# Patient Record
Sex: Female | Born: 1995 | Race: Black or African American | Hispanic: No | Marital: Single | Smoking: Never smoker
Health system: Southern US, Community
[De-identification: ages and names within clinical notes are randomized; demographics above are authoritative.]

## PROBLEM LIST (undated history)

## (undated) DIAGNOSIS — Z201 Contact with and (suspected) exposure to tuberculosis: Secondary | ICD-10-CM

## (undated) DIAGNOSIS — D649 Anemia, unspecified: Secondary | ICD-10-CM

## (undated) DIAGNOSIS — R112 Nausea with vomiting, unspecified: Secondary | ICD-10-CM

## (undated) DIAGNOSIS — K219 Gastro-esophageal reflux disease without esophagitis: Secondary | ICD-10-CM

## (undated) DIAGNOSIS — Z9109 Other allergy status, other than to drugs and biological substances: Secondary | ICD-10-CM

## (undated) DIAGNOSIS — Z9889 Other specified postprocedural states: Secondary | ICD-10-CM

## (undated) DIAGNOSIS — E282 Polycystic ovarian syndrome: Secondary | ICD-10-CM

## (undated) DIAGNOSIS — E119 Type 2 diabetes mellitus without complications: Secondary | ICD-10-CM

## (undated) DIAGNOSIS — Z23 Encounter for immunization: Secondary | ICD-10-CM

## (undated) DIAGNOSIS — E039 Hypothyroidism, unspecified: Secondary | ICD-10-CM

## (undated) DIAGNOSIS — R519 Headache, unspecified: Secondary | ICD-10-CM

## (undated) DIAGNOSIS — R51 Headache: Secondary | ICD-10-CM

## (undated) HISTORY — DX: Hypothyroidism, unspecified: E03.9

## (undated) HISTORY — DX: Encounter for immunization: Z23

## (undated) HISTORY — PX: COLONOSCOPY: SHX174

---
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
## 2014-01-08 MED ORDER — CEFAZOLIN SODIUM-DEXTROSE 2-3 GM-% IV SOLR
2.0000 g | INTRAVENOUS | Status: AC
  Administered 2014-01-09: 2 g via INTRAVENOUS
  Filled 2014-01-08: qty 50

## 2014-01-09 ENCOUNTER — Inpatient Hospital Stay (HOSPITAL_COMMUNITY)
Admission: RE | Admit: 2014-01-09 | Discharge: 2014-01-10 | DRG: 032 | Disposition: A | Discharge status: Home / Self Care | Payer: 59 | Source: Ambulatory Visit | Attending: Neurosurgery | Admitting: Neurosurgery

## 2014-01-09 ENCOUNTER — Encounter (HOSPITAL_COMMUNITY)
Admission: RE | Disposition: A | Discharge status: Home / Self Care | Payer: Self-pay | Source: Ambulatory Visit | Attending: Neurosurgery

## 2014-01-09 ENCOUNTER — Encounter (HOSPITAL_COMMUNITY): Payer: Self-pay | Admitting: *Deleted

## 2014-01-09 ENCOUNTER — Inpatient Hospital Stay (HOSPITAL_COMMUNITY): Payer: 59 | Admitting: Anesthesiology

## 2014-01-09 ENCOUNTER — Inpatient Hospital Stay (HOSPITAL_COMMUNITY): Payer: 59 | Admitting: Vascular Surgery

## 2014-01-09 DIAGNOSIS — E669 Obesity, unspecified: Secondary | ICD-10-CM | POA: Diagnosis present

## 2014-01-09 DIAGNOSIS — J3089 Other allergic rhinitis: Secondary | ICD-10-CM | POA: Diagnosis present

## 2014-01-09 DIAGNOSIS — Z91018 Allergy to other foods: Secondary | ICD-10-CM

## 2014-01-09 DIAGNOSIS — Z888 Allergy status to other drugs, medicaments and biological substances status: Secondary | ICD-10-CM | POA: Diagnosis not present

## 2014-01-09 DIAGNOSIS — Z79899 Other long term (current) drug therapy: Secondary | ICD-10-CM

## 2014-01-09 DIAGNOSIS — K219 Gastro-esophageal reflux disease without esophagitis: Secondary | ICD-10-CM | POA: Diagnosis present

## 2014-01-09 DIAGNOSIS — G932 Benign intracranial hypertension: Principal | ICD-10-CM | POA: Diagnosis present

## 2014-01-09 DIAGNOSIS — Z201 Contact with and (suspected) exposure to tuberculosis: Secondary | ICD-10-CM | POA: Diagnosis present

## 2014-01-09 DIAGNOSIS — I674 Hypertensive encephalopathy: Secondary | ICD-10-CM | POA: Diagnosis present

## 2014-01-09 DIAGNOSIS — G43909 Migraine, unspecified, not intractable, without status migrainosus: Secondary | ICD-10-CM | POA: Diagnosis present

## 2014-01-09 DIAGNOSIS — E282 Polycystic ovarian syndrome: Secondary | ICD-10-CM | POA: Diagnosis present

## 2014-01-09 DIAGNOSIS — E119 Type 2 diabetes mellitus without complications: Secondary | ICD-10-CM | POA: Diagnosis present

## 2014-01-09 HISTORY — PX: VENTRICULOPERITONEAL SHUNT: SHX204

## 2014-01-09 LAB — GLUCOSE, CAPILLARY
Glucose-Capillary: 64 mg/dL — ABNORMAL LOW (ref 70–99)
Glucose-Capillary: 64 mg/dL — ABNORMAL LOW (ref 70–99)
Glucose-Capillary: 94 mg/dL (ref 70–99)

## 2014-01-09 SURGERY — SHUNT INSERTION VENTRICULAR-PERITONEAL
Anesthesia: General | Site: Head | Laterality: Right

## 2014-01-09 MED ORDER — ONDANSETRON HCL 4 MG/2ML IJ SOLN
INTRAMUSCULAR | Status: DC | PRN
  Administered 2014-01-09: 4 mg via INTRAVENOUS

## 2014-01-09 MED ORDER — ROCURONIUM BROMIDE 50 MG/5ML IV SOLN
INTRAVENOUS | Status: AC
  Filled 2014-01-09: qty 1

## 2014-01-09 MED ORDER — MIDAZOLAM HCL 2 MG/2ML IJ SOLN
INTRAMUSCULAR | Status: AC
  Filled 2014-01-09: qty 2

## 2014-01-09 MED ORDER — THROMBIN 5000 UNITS EX SOLR
CUTANEOUS | Status: DC | PRN
  Administered 2014-01-09 (×2): 5000 [IU] via TOPICAL

## 2014-01-09 MED ORDER — NALOXONE HCL 0.4 MG/ML IJ SOLN: 0.0800 mg | INTRAMUSCULAR | Status: DC | PRN

## 2014-01-09 MED ORDER — FLEET ENEMA 7-19 GM/118ML RE ENEM
1.0000 | ENEMA | Freq: Once | RECTAL | Status: AC | PRN
  Filled 2014-01-09: qty 1

## 2014-01-09 MED ORDER — OXYCODONE HCL 5 MG PO TABS: 5.0000 mg | ORAL_TABLET | Freq: Once | ORAL | Status: DC | PRN

## 2014-01-09 MED ORDER — FERROUS SULFATE 28 MG PO TABS: 28.0000 mg | ORAL_TABLET | Freq: Every day | ORAL | Status: DC

## 2014-01-09 MED ORDER — FERROUS SULFATE 325 (65 FE) MG PO TABS
325.0000 mg | ORAL_TABLET | Freq: Every day | ORAL | Status: DC
  Administered 2014-01-09 – 2014-01-10 (×2): 325 mg via ORAL
  Filled 2014-01-09 (×3): qty 1

## 2014-01-09 MED ORDER — PROPOFOL 10 MG/ML IV BOLUS
INTRAVENOUS | Status: DC | PRN
  Administered 2014-01-09: 150 mg via INTRAVENOUS
  Administered 2014-01-09: 50 mg via INTRAVENOUS

## 2014-01-09 MED ORDER — SODIUM CHLORIDE 0.9 % IV SOLN
10.0000 mg | INTRAVENOUS | Status: DC | PRN
  Administered 2014-01-09: 10 ug/min via INTRAVENOUS

## 2014-01-09 MED ORDER — FENTANYL CITRATE 0.05 MG/ML IJ SOLN
INTRAMUSCULAR | Status: DC | PRN
  Administered 2014-01-09 (×3): 50 ug via INTRAVENOUS
  Administered 2014-01-09 (×2): 75 ug via INTRAVENOUS

## 2014-01-09 MED ORDER — PROPOFOL 10 MG/ML IV BOLUS
INTRAVENOUS | Status: AC
  Filled 2014-01-09: qty 20

## 2014-01-09 MED ORDER — GLYCOPYRROLATE 0.2 MG/ML IJ SOLN
INTRAMUSCULAR | Status: AC
  Filled 2014-01-09: qty 2

## 2014-01-09 MED ORDER — PHENYLEPHRINE HCL 10 MG/ML IJ SOLN
INTRAMUSCULAR | Status: DC | PRN
  Administered 2014-01-09: 120 ug via INTRAVENOUS
  Administered 2014-01-09 (×2): 40 ug via INTRAVENOUS

## 2014-01-09 MED ORDER — LEVOCETIRIZINE DIHYDROCHLORIDE 5 MG PO TABS: 5.0000 mg | ORAL_TABLET | Freq: Every evening | ORAL | Status: DC

## 2014-01-09 MED ORDER — LORATADINE 10 MG PO TABS
10.0000 mg | ORAL_TABLET | Freq: Every day | ORAL | Status: DC
  Administered 2014-01-09 – 2014-01-10 (×2): 10 mg via ORAL
  Filled 2014-01-09 (×2): qty 1

## 2014-01-09 MED ORDER — METFORMIN HCL 500 MG PO TABS
500.0000 mg | ORAL_TABLET | Freq: Every day | ORAL | Status: DC
  Administered 2014-01-09: 500 mg via ORAL
  Filled 2014-01-09 (×2): qty 1

## 2014-01-09 MED ORDER — HYDROCODONE-ACETAMINOPHEN 5-325 MG PO TABS
1.0000 | ORAL_TABLET | ORAL | Status: DC | PRN
  Administered 2014-01-09 – 2014-01-10 (×3): 1 via ORAL
  Filled 2014-01-09 (×3): qty 1

## 2014-01-09 MED ORDER — HYDROMORPHONE HCL 1 MG/ML IJ SOLN
INTRAMUSCULAR | Status: AC
  Filled 2014-01-09: qty 1

## 2014-01-09 MED ORDER — ONDANSETRON HCL 4 MG/2ML IJ SOLN
INTRAMUSCULAR | Status: AC
  Filled 2014-01-09: qty 2

## 2014-01-09 MED ORDER — FUROSEMIDE 20 MG PO TABS
20.0000 mg | ORAL_TABLET | Freq: Two times a day (BID) | ORAL | Status: DC
  Administered 2014-01-09 – 2014-01-10 (×3): 20 mg via ORAL
  Filled 2014-01-09 (×4): qty 1

## 2014-01-09 MED ORDER — POTASSIUM CHLORIDE IN NACL 20-0.9 MEQ/L-% IV SOLN
INTRAVENOUS | Status: DC
  Administered 2014-01-09: 80 mL via INTRAVENOUS
  Administered 2014-01-10: 05:00:00 via INTRAVENOUS
  Filled 2014-01-09 (×4): qty 1000

## 2014-01-09 MED ORDER — MAGNESIUM OXIDE 400 MG PO TABS: 400.0000 mg | ORAL_TABLET | Freq: Every day | ORAL | Status: DC

## 2014-01-09 MED ORDER — LACTATED RINGERS IV SOLN
INTRAVENOUS | Status: DC
  Administered 2014-01-09 (×2): via INTRAVENOUS

## 2014-01-09 MED ORDER — MAGNESIUM OXIDE 400 (241.3 MG) MG PO TABS
400.0000 mg | ORAL_TABLET | Freq: Every day | ORAL | Status: DC
  Administered 2014-01-09 – 2014-01-10 (×2): 400 mg via ORAL
  Filled 2014-01-09 (×2): qty 1

## 2014-01-09 MED ORDER — FENTANYL CITRATE 0.05 MG/ML IJ SOLN
INTRAMUSCULAR | Status: AC
  Filled 2014-01-09: qty 5

## 2014-01-09 MED ORDER — LIDOCAINE HCL (CARDIAC) 20 MG/ML IV SOLN
INTRAVENOUS | Status: DC | PRN
  Administered 2014-01-09: 80 mg via INTRAVENOUS

## 2014-01-09 MED ORDER — CEFAZOLIN SODIUM 1-5 GM-% IV SOLN
1.0000 g | Freq: Three times a day (TID) | INTRAVENOUS | Status: AC
  Administered 2014-01-09 – 2014-01-10 (×2): 1 g via INTRAVENOUS
  Filled 2014-01-09 (×2): qty 50

## 2014-01-09 MED ORDER — 0.9 % SODIUM CHLORIDE (POUR BTL) OPTIME
TOPICAL | Status: DC | PRN
  Administered 2014-01-09: 1000 mL

## 2014-01-09 MED ORDER — HYDROMORPHONE HCL 1 MG/ML IJ SOLN
0.2500 mg | INTRAMUSCULAR | Status: DC | PRN
  Administered 2014-01-09 (×2): 0.5 mg via INTRAVENOUS

## 2014-01-09 MED ORDER — MIDAZOLAM HCL 5 MG/5ML IJ SOLN
INTRAMUSCULAR | Status: DC | PRN
  Administered 2014-01-09: 2 mg via INTRAVENOUS

## 2014-01-09 MED ORDER — NEOSTIGMINE METHYLSULFATE 10 MG/10ML IV SOLN
INTRAVENOUS | Status: DC | PRN
  Administered 2014-01-09: 3 mg via INTRAVENOUS

## 2014-01-09 MED ORDER — POLYETHYLENE GLYCOL 3350 17 G PO PACK
17.0000 g | PACK | Freq: Every day | ORAL | Status: DC | PRN
Start: 2014-01-09 — End: 2014-01-10
  Filled 2014-01-09: qty 1

## 2014-01-09 MED ORDER — POTASSIUM CHLORIDE CRYS ER 10 MEQ PO TBCR
10.0000 meq | EXTENDED_RELEASE_TABLET | Freq: Every day | ORAL | Status: DC
  Administered 2014-01-09 – 2014-01-10 (×2): 10 meq via ORAL
  Filled 2014-01-09 (×2): qty 1

## 2014-01-09 MED ORDER — PANTOPRAZOLE SODIUM 40 MG PO TBEC
40.0000 mg | DELAYED_RELEASE_TABLET | Freq: Every day | ORAL | Status: DC
  Administered 2014-01-09: 40 mg via ORAL
  Filled 2014-01-09: qty 1

## 2014-01-09 MED ORDER — PHENYLEPHRINE 40 MCG/ML (10ML) SYRINGE FOR IV PUSH (FOR BLOOD PRESSURE SUPPORT)
PREFILLED_SYRINGE | INTRAVENOUS | Status: AC
  Filled 2014-01-09: qty 10

## 2014-01-09 MED ORDER — MONTELUKAST SODIUM 10 MG PO TABS
10.0000 mg | ORAL_TABLET | Freq: Every day | ORAL | Status: DC
  Administered 2014-01-09: 10 mg via ORAL
  Filled 2014-01-09 (×2): qty 1

## 2014-01-09 MED ORDER — ROCURONIUM BROMIDE 100 MG/10ML IV SOLN
INTRAVENOUS | Status: DC | PRN
  Administered 2014-01-09: 50 mg via INTRAVENOUS

## 2014-01-09 MED ORDER — HEMOSTATIC AGENTS (NO CHARGE) OPTIME
TOPICAL | Status: DC | PRN
  Administered 2014-01-09: 1 via TOPICAL

## 2014-01-09 MED ORDER — ACETAZOLAMIDE 250 MG PO TABS
750.0000 mg | ORAL_TABLET | Freq: Three times a day (TID) | ORAL | Status: DC
  Administered 2014-01-09 – 2014-01-10 (×4): 750 mg via ORAL
  Filled 2014-01-09 (×5): qty 3

## 2014-01-09 MED ORDER — LIDOCAINE HCL (CARDIAC) 20 MG/ML IV SOLN
INTRAVENOUS | Status: AC
  Filled 2014-01-09: qty 5

## 2014-01-09 MED ORDER — PROMETHAZINE HCL 25 MG PO TABS
12.5000 mg | ORAL_TABLET | ORAL | Status: DC | PRN
  Administered 2014-01-09: 25 mg via ORAL
  Filled 2014-01-09: qty 1

## 2014-01-09 MED ORDER — GLYCOPYRROLATE 0.2 MG/ML IJ SOLN
INTRAMUSCULAR | Status: DC | PRN
  Administered 2014-01-09: 0.4 mg via INTRAVENOUS

## 2014-01-09 MED ORDER — OXYCODONE HCL 5 MG/5ML PO SOLN
5.0000 mg | Freq: Once | ORAL | Status: DC | PRN
Start: 2014-01-09 — End: 2014-01-09

## 2014-01-09 MED ORDER — ONDANSETRON HCL 4 MG/2ML IJ SOLN
4.0000 mg | INTRAMUSCULAR | Status: DC | PRN
  Administered 2014-01-09: 4 mg via INTRAVENOUS
  Filled 2014-01-09: qty 2

## 2014-01-09 MED ORDER — PROMETHAZINE HCL 25 MG/ML IJ SOLN: 6.2500 mg | INTRAMUSCULAR | Status: DC | PRN

## 2014-01-09 MED ORDER — ONDANSETRON HCL 4 MG PO TABS: 4.0000 mg | ORAL_TABLET | ORAL | Status: DC | PRN

## 2014-01-09 MED ORDER — MORPHINE SULFATE 2 MG/ML IJ SOLN
1.0000 mg | INTRAMUSCULAR | Status: DC | PRN
  Administered 2014-01-09: 1 mg via INTRAVENOUS
  Filled 2014-01-09: qty 1

## 2014-01-09 MED ORDER — BISACODYL 5 MG PO TBEC
5.0000 mg | DELAYED_RELEASE_TABLET | Freq: Every day | ORAL | Status: DC | PRN
  Filled 2014-01-09: qty 1

## 2014-01-09 MED ORDER — VECURONIUM BROMIDE 10 MG IV SOLR
INTRAVENOUS | Status: DC | PRN
  Administered 2014-01-09: 1 mg via INTRAVENOUS

## 2014-01-09 MED ORDER — SENNA 8.6 MG PO TABS
1.0000 | ORAL_TABLET | Freq: Two times a day (BID) | ORAL | Status: DC
  Administered 2014-01-09: 8.6 mg via ORAL
  Filled 2014-01-09 (×3): qty 1

## 2014-01-09 SURGICAL SUPPLY — 65 items
BLADE CLIPPER SURG (BLADE) ×2 IMPLANT
BLADE SURG 11 STRL SS (BLADE) ×2 IMPLANT
BOOT SUTURE AID YELLOW STND (SUTURE) ×2 IMPLANT
BRUSH SCRUB EZ 1% IODOPHOR (MISCELLANEOUS) IMPLANT
BUR ACORN 6.0 PRECISION (BURR) ×2 IMPLANT
CANISTER SUCT 3000ML (MISCELLANEOUS) ×2 IMPLANT
CLIP RANEY DISP (INSTRUMENTS) ×2 IMPLANT
CONNECTOR SHUNT 1.2MMX1.8MM (MISCELLANEOUS) ×2 IMPLANT
CONT SPEC 4OZ CLIKSEAL STRL BL (MISCELLANEOUS) ×2 IMPLANT
COVER BACK TABLE 60X90IN (DRAPES) IMPLANT
DECANTER SPIKE VIAL GLASS SM (MISCELLANEOUS) ×2 IMPLANT
DERMABOND ADHESIVE PROPEN (GAUZE/BANDAGES/DRESSINGS) ×1
DERMABOND ADVANCED .7 DNX6 (GAUZE/BANDAGES/DRESSINGS) ×1 IMPLANT
DRAPE INCISE IOBAN 85X60 (DRAPES) ×2 IMPLANT
DRAPE ORTHO SPLIT 77X108 STRL (DRAPES) ×2
DRAPE POUCH INSTRU U-SHP 10X18 (DRAPES) ×2 IMPLANT
DRAPE SURG ORHT 6 SPLT 77X108 (DRAPES) ×2 IMPLANT
DRSG OPSITE 4X5.5 SM (GAUZE/BANDAGES/DRESSINGS) ×2 IMPLANT
DRSG TEGADERM 4X4.75 (GAUZE/BANDAGES/DRESSINGS) ×2 IMPLANT
DRSG TELFA 3X8 NADH (GAUZE/BANDAGES/DRESSINGS) ×4 IMPLANT
DURAPREP 26ML APPLICATOR (WOUND CARE) ×2 IMPLANT
ELECT REM PT RETURN 9FT ADLT (ELECTROSURGICAL) ×2
ELECTRODE REM PT RTRN 9FT ADLT (ELECTROSURGICAL) ×1 IMPLANT
GAUZE SPONGE 4X4 16PLY XRAY LF (GAUZE/BANDAGES/DRESSINGS) ×2 IMPLANT
GLOVE BIOGEL PI IND STRL 7.5 (GLOVE) ×1 IMPLANT
GLOVE BIOGEL PI IND STRL 8 (GLOVE) ×2 IMPLANT
GLOVE BIOGEL PI INDICATOR 7.5 (GLOVE) ×1
GLOVE BIOGEL PI INDICATOR 8 (GLOVE) ×2
GLOVE ECLIPSE 6.5 STRL STRAW (GLOVE) ×4 IMPLANT
GLOVE ECLIPSE 7.5 STRL STRAW (GLOVE) ×6 IMPLANT
GLOVE EXAM NITRILE LRG STRL (GLOVE) IMPLANT
GLOVE EXAM NITRILE MD LF STRL (GLOVE) IMPLANT
GLOVE EXAM NITRILE XL STR (GLOVE) IMPLANT
GLOVE EXAM NITRILE XS STR PU (GLOVE) IMPLANT
GOWN STRL REUS W/ TWL LRG LVL3 (GOWN DISPOSABLE) ×2 IMPLANT
GOWN STRL REUS W/ TWL XL LVL3 (GOWN DISPOSABLE) ×1 IMPLANT
GOWN STRL REUS W/TWL 2XL LVL3 (GOWN DISPOSABLE) ×4 IMPLANT
GOWN STRL REUS W/TWL LRG LVL3 (GOWN DISPOSABLE) ×2
GOWN STRL REUS W/TWL XL LVL3 (GOWN DISPOSABLE) ×1
HEMOSTAT SURGICEL 2X14 (HEMOSTASIS) IMPLANT
KIT BASIN OR (CUSTOM PROCEDURE TRAY) ×2 IMPLANT
KIT ROOM TURNOVER OR (KITS) ×2 IMPLANT
MARKER SKIN DUAL TIP RULER LAB (MISCELLANEOUS) ×4 IMPLANT
NEEDLE HYPO 25X1 1.5 SAFETY (NEEDLE) ×2 IMPLANT
NS IRRIG 1000ML POUR BTL (IV SOLUTION) ×2 IMPLANT
PACK LAMINECTOMY NEURO (CUSTOM PROCEDURE TRAY) ×2 IMPLANT
PAD ARMBOARD 7.5X6 YLW CONV (MISCELLANEOUS) ×4 IMPLANT
SPONGE LAP 4X18 X RAY DECT (DISPOSABLE) IMPLANT
SPONGE SURGIFOAM ABS GEL 12-7 (HEMOSTASIS) IMPLANT
STAPLER SKIN PROX WIDE 3.9 (STAPLE) ×2 IMPLANT
STRIP CLOSURE SKIN 1/4X4 (GAUZE/BANDAGES/DRESSINGS) IMPLANT
SUT BONE WAX W31G (SUTURE) IMPLANT
SUT ETHILON 3 0 PS 1 (SUTURE) IMPLANT
SUT NURALON 4 0 TR CR/8 (SUTURE) IMPLANT
SUT SILK 0 TIES 10X30 (SUTURE) ×2 IMPLANT
SUT SILK 3 0 SH 30 (SUTURE) IMPLANT
SUT VIC AB 2-0 CT2 18 VCP726D (SUTURE) ×6 IMPLANT
SUT VIC AB 3-0 SH 8-18 (SUTURE) ×4 IMPLANT
SYR CONTROL 10ML LL (SYRINGE) ×2 IMPLANT
SYR TB 1ML 25GX5/8 (SYRINGE) ×2 IMPLANT
TOWEL OR 17X24 6PK STRL BLUE (TOWEL DISPOSABLE) ×4 IMPLANT
TOWEL OR 17X26 10 PK STRL BLUE (TOWEL DISPOSABLE) ×2 IMPLANT
UNDERPAD 30X30 INCONTINENT (UNDERPADS AND DIAPERS) IMPLANT
VALVE RT ANGLE UNITIZE DIST (Valve) ×4 IMPLANT
WATER STERILE IRR 1000ML POUR (IV SOLUTION) ×2 IMPLANT

## 2014-01-09 NOTE — Transfer of Care (Signed)
Immediate Anesthesia Transfer of Care Note  Patient: Theresa Hicks  Procedure(s) Performed: Procedure(s) with comments: SHUNT INSERTION VENTRICULAR-PERITONEAL (Right) - right   Patient Location: PACU  Anesthesia Type:General  Level of Consciousness: awake, alert  and oriented  Airway & Oxygen Therapy: Patient Spontanous Breathing and Patient connected to nasal cannula oxygen  Post-op Assessment: Report given to PACU RN, Post -op Vital signs reviewed and stable and Patient moving all extremities X 4  Post vital signs: Reviewed and stable  Complications: No apparent anesthesia complications

## 2014-01-09 NOTE — Progress Notes (Signed)
eLink Physician-Brief Progress Note Patient Name: Theresa Hicks DOB: 1995/03/20 MRN: [id number]   Date of Service  01/09/2014  HPI/Events of Note  New patient Post VP shunt for pseudotumor  eICU Interventions  No eICU intervention needed     Intervention Category Evaluation Type: New Patient Evaluation  Theresa Hicks 01/09/2014, 5:19 PM

## 2014-01-09 NOTE — Anesthesia Procedure Notes (Signed)
Procedure Name: Intubation Date/Time: 01/09/2014 12:10 PM Performed by: Sharlene DoryWALKER, Kayelee Herbig E Pre-anesthesia Checklist: Patient identified, Emergency Drugs available, Suction available, Patient being monitored and Timeout performed Patient Re-evaluated:Patient Re-evaluated prior to inductionOxygen Delivery Method: Circle system utilized Preoxygenation: Pre-oxygenation with 100% oxygen Intubation Type: IV induction Ventilation: Mask ventilation without difficulty Laryngoscope Size: Mac and 4 Grade View: Grade I Tube type: Oral Tube size: 7.0 mm Number of attempts: 1 Airway Equipment and Method: Stylet Placement Confirmation: ETT inserted through vocal cords under direct vision,  positive ETCO2 and breath sounds checked- equal and bilateral Secured at: 21 cm Tube secured with: Tape Dental Injury: Teeth and Oropharynx as per pre-operative assessment

## 2014-01-09 NOTE — Anesthesia Preprocedure Evaluation (Addendum)
Anesthesia Evaluation  Patient identified by MRN, date of birth, ID band Patient awake    Reviewed: Allergy & Precautions, H&P , NPO status , Patient's Chart, lab work & pertinent test results  History of Anesthesia Complications Negative for: history of anesthetic complications  Airway Mallampati: I   Neck ROM: Full    Dental  (+) Teeth Intact   Pulmonary  breath sounds clear to auscultation        Cardiovascular Rhythm:Regular Rate:Normal     Neuro/Psych  Headaches,    GI/Hepatic GERD-  ,  Endo/Other  diabetes  Renal/GU      Musculoskeletal   Abdominal   Peds  Hematology   Anesthesia Other Findings   Reproductive/Obstetrics                            Anesthesia Physical Anesthesia Plan  ASA: II  Anesthesia Plan: General   Post-op Pain Management:    Induction:   Airway Management Planned: Oral ETT  Additional Equipment:   Intra-op Plan:   Post-operative Plan: Extubation in OR  Informed Consent: I have reviewed the patients History and Physical, chart, labs and discussed the procedure including the risks, benefits and alternatives for the proposed anesthesia with the patient or authorized representative who has indicated his/her understanding and acceptance.   Dental advisory given  Plan Discussed with: CRNA and Surgeon  Anesthesia Plan Comments:         Anesthesia Quick Evaluation

## 2014-01-09 NOTE — Op Note (Signed)
01/09/2014  3:19 PM  PATIENT:  Theresa Hicks  18 y.o. female with intracranial hypertension and persistent headaches  PRE-OPERATIVE DIAGNOSIS:  idiopathic intracranial hypertension  POST-OPERATIVE DIAGNOSIS:  Idiopathic intracranial hypertension  PROCEDURE:  right Frontal ventriculoperitoneal shunt creation Codman-Hakim programmable valve set at 18900mmH2O  SURGEON:  Surgeon(s): Coletta MemosKyle Athena Baltz, MD Tia Alertavid S Jones, MD  ASSISTANTS:Jones, Onalee Huaavid  ANESTHESIA:   General  EBL:  Total I/O In: 1300 [I.V.:1300] Out: 100 [Blood:100]  COUNT:per nursing  SPECIMEN:  No Specimen  DICTATION: Ms. Alferd PateeRichmond was brought to the operating room intubated and placed under a general anesthetic without difficulty. She was positioned supine on the operating room table. Her head was shaved and prepped in a sterile manner. The neck and abdomen was also prepped and draped in a sterile manner. I infiltrated lidocaine into the abdomen, in the right upper quadrant just off the midline. I also infiltrated lidocaine into the planned scalp incision.  I opened the abdomen with a 10 blade and dissected through the subcutaneous tissue to the anterior rectus sheath. I opened the anterior rectus sheath with Metzenbaum scissors. I then bluntly divided the rectus muscle and retracted it with vicryl sutures, exposing the posterior rectus sheath. I opened the posterior rectus sheath with Metzenbaum scissors just enough to expose the peritoneum. I pulled the peritoneum up with straight snaps and secured and released it a few times to make sure no bowel would be harmed. I opened the peritoneum with the scissors and secured the edges with the snaps.  I then tunneled from the abdominal incision to the post auricular region. I opened the skin with a scalpel over the tunneler then brought the tunneler through the incision. I passed a silk tie from the post auricular incision to the abdominal incisioin, securing the ends with hemostats. I  then opened the scalp incision with a 10 blade. I created a burr hole with the drill and acorn attachment. I tunneled from the scalp incision to the post auricular incision using another silk tie. I brought the shunt into the operative field and using the ties pulled the distal end of the catheter out of the abdominal incision.  I opened the dura and placed the ventricular catheter into the lateral ventricle. We then cut the catheter and secured it to the integrated shunt. We observed  flow from the distal end of the catheter. At this time the distal catheter was placed into the peritoneum, and the abdominal incision was closed in layers using vicryl sutures. Dermabond was used for a sterile dressing. The cranial incisions were then closed with galeal sutures, and the scalp with staples. Sterile dressings were applied.   PLAN OF CARE: Admit to inpatient   PATIENT DISPOSITION:  PACU - hemodynamically stable.   Delay start of Pharmacological VTE agent (>24hrs) due to surgical blood loss or risk of bleeding:  yes

## 2014-01-09 NOTE — H&P (Signed)
Theresa Hicks is an 18 y.o. female.   Chief Complaint: with psuedotumor cerebri HPI: history of headaches, relieved by spinal tap. Has decided to pursue a more durable treatment.  Past Medical History  Diagnosis Date  . Environmental allergies   . 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   Marland Kitchen. GERD (gastroesophageal reflux disease)   . Diabetes mellitus without complication     prediabetes, but takes Metformin for PCOS  . PCOS (polycystic ovarian syndrome)   . Headache     migraines & Intracranial hypertension     Past Surgical History  Procedure Laterality Date  . Wisdom tooth extraction  2015  . Colonoscopy      & endoscopy done at the same time as the colonoscopy    History reviewed. No pertinent family history. Social History:  reports that she has never smoked. She does not have any smokeless tobacco history on file. She reports that she does not drink alcohol or use illicit drugs.  Allergies:  Allergies  Allergen Reactions  . Imitrex [Sumatriptan] Anaphylaxis  . Peanuts [Peanut Oil] Anaphylaxis    Medications Prior to Admission  Medication Sig Dispense Refill  . acetaZOLAMIDE (DIAMOX) 250 MG tablet Take 750 mg by mouth 3 (three) times daily.    . Ferrous Sulfate 28 MG TABS Take 28 mg by mouth daily.    . furosemide (LASIX) 20 MG tablet Take 20 mg by mouth 2 (two) times daily.    Marland Kitchen. ketorolac (TORADOL) 30 MG/ML injection Inject 30 mg into the muscle every 30 (thirty) days.    Marland Kitchen. levocetirizine (XYZAL) 5 MG tablet Take 5 mg by mouth every evening.     . magnesium oxide (MAG-OX) 400 MG tablet Take 400 mg by mouth daily.    . metFORMIN (GLUCOPHAGE) 500 MG tablet Take 500 mg by mouth at bedtime.     . montelukast (SINGULAIR) 10 MG tablet Take 10 mg by mouth at bedtime.    . pantoprazole (PROTONIX) 40 MG tablet Take 40 mg by mouth at bedtime.     . potassium chloride (K-DUR,KLOR-CON) 10 MEQ tablet Take 10 mEq by mouth daily.       Results for orders placed or performed during the hospital encounter of 01/09/14 (from the past 48 hour(s))  Glucose, capillary     Status: Abnormal   Collection Time: 01/09/14  9:14 AM  Result Value Ref Range   Glucose-Capillary 64 (L) 70 - 99 mg/dL   No results found.  Review of Systems  Constitutional: Negative.   Eyes: Positive for blurred vision.  Respiratory: Negative.   Cardiovascular: Negative.   Gastrointestinal: Negative.   Genitourinary: Negative.   Musculoskeletal: Positive for neck pain.  Skin: Negative.   Neurological:       Headache  Endo/Heme/Allergies: Negative.   Psychiatric/Behavioral: Negative.     Blood pressure 132/55, pulse 74, temperature 98.7 F (37.1 C), temperature source Oral, resp. rate 20, height 5' 3.5" (1.613 m), weight 92.987 kg (205 lb), SpO2 100 %. Physical Exam  Vitals reviewed. Constitutional: She is oriented to person, place, and time. She appears well-developed and well-nourished.  HENT:  Head: Normocephalic and atraumatic.  Eyes: Conjunctivae and EOM are normal. Pupils are equal, round, and reactive to light.  Neck: Normal range of motion. Neck supple.  Cardiovascular: Normal rate, regular rhythm and normal heart sounds.   Respiratory: Effort normal and breath sounds normal.  GI: Soft. Bowel sounds are normal.  Musculoskeletal: Normal range of motion.  Neurological: She is alert and oriented to person, place, and time. She has normal reflexes. She displays normal reflexes. No cranial nerve deficit. She exhibits normal muscle tone. Coordination normal.  Skin: Skin is warm and dry.  Psychiatric: She has a normal mood and affect. Her behavior is normal. Judgment and thought content normal.     Assessment/Plan OR for ventriculoperitoneal shunting  Forever Arechiga L 01/09/2014, 11:56 AM

## 2014-01-10 ENCOUNTER — Encounter (HOSPITAL_COMMUNITY): Payer: Self-pay

## 2014-01-10 ENCOUNTER — Inpatient Hospital Stay (HOSPITAL_COMMUNITY): Payer: 59

## 2014-01-10 MED ORDER — HYDROCODONE-ACETAMINOPHEN 5-325 MG PO TABS: 1.0000 | ORAL_TABLET | Freq: Four times a day (QID) | ORAL | Status: DC | PRN

## 2014-01-10 NOTE — Discharge Summary (Signed)
  Physician Discharge Summary  Patient ID: Theresa Hicks MRN: [id number] DOB/AGE: Oct 28, 1995 18 y.o.  Admit date: 01/09/2014 Discharge date: 01/10/2014  Admission Diagnoses:Intracranial hypertension  Discharge Diagnoses:  Active Problems:   Intracranial hypertension   Discharged Condition: good  Hospital Course: Ms. Alferd PateeRichmond was admitted and taken to the operating room for an uncomplicated placement of a Right frontal VP shunt. She has been headache free since the shunt placement. I turned her valve pressure to 80(codman hakim valve) prior to discharge. Her wounds are clean, dry, and without signs of infection. She is walking, tolerating a regular diet, and voiding. She will be sent home with followup in 10 days.   Treatments: surgery: as above  Discharge Exam: Blood pressure 106/68, pulse 107, temperature 98.9 F (37.2 C), temperature source Oral, resp. rate 26, height 5' 3.5" (1.613 m), weight 92.987 kg (205 lb), SpO2 100 %. General appearance: alert, cooperative, appears stated age and no distress Neurologic: Alert and oriented X 3, normal strength and tone. Normal symmetric reflexes. Normal coordination and gait  Disposition: Final discharge disposition not confirmed idiopathic intracranial hypertension    Medication List    TAKE these medications        acetaZOLAMIDE 250 MG tablet  Commonly known as:  DIAMOX  Take 750 mg by mouth 3 (three) times daily.     Ferrous Sulfate 28 MG Tabs  Take 28 mg by mouth daily.     furosemide 20 MG tablet  Commonly known as:  LASIX  Take 20 mg by mouth 2 (two) times daily.     HYDROcodone-acetaminophen 5-325 MG per tablet  Commonly known as:  NORCO  Take 1 tablet by mouth every 6 (six) hours as needed for moderate pain.     ketorolac 30 MG/ML injection  Commonly known as:  TORADOL  Inject 30 mg into the muscle every 30 (thirty) days.     levocetirizine 5 MG tablet  Commonly known as:  XYZAL  Take 5 mg by mouth every  evening.     magnesium oxide 400 MG tablet  Commonly known as:  MAG-OX  Take 400 mg by mouth daily.     metFORMIN 500 MG tablet  Commonly known as:  GLUCOPHAGE  Take 500 mg by mouth at bedtime.     montelukast 10 MG tablet  Commonly known as:  SINGULAIR  Take 10 mg by mouth at bedtime.     pantoprazole 40 MG tablet  Commonly known as:  PROTONIX  Take 40 mg by mouth at bedtime.     potassium chloride 10 MEQ tablet  Commonly known as:  K-DUR,KLOR-CON  Take 10 mEq by mouth daily.           Follow-up Information    Follow up with Zamzam Whinery L, MD In 10 days.   Specialty:  Neurosurgery   Why:  call for staple removal   Contact information:   [address] [place] [address] [phone]       Signed: Kohler Pellerito L 01/10/2014, 6:58 PM

## 2014-01-10 NOTE — Progress Notes (Signed)
UR completed.  Amberley Hamler, RN BSN [facility] CCM Trauma/Neuro ICU Case Manager [phone]  

## 2014-01-10 NOTE — Progress Notes (Signed)
Pt discharged from unit to home at 1857.  Discharge information given with patient and mothers understanding of instructions. Herma ArdMesser, Suraj Ramdass RN BSN.

## 2014-01-10 NOTE — Discharge Instructions (Signed)
.  kc °

## 2014-01-14 ENCOUNTER — Encounter (HOSPITAL_COMMUNITY): Payer: Self-pay | Admitting: Neurosurgery

## 2014-01-21 NOTE — Anesthesia Postprocedure Evaluation (Signed)
  Anesthesia Post-op Note  Patient: Theresa Hicks  Procedure(s) Performed: Procedure(s) with comments: SHUNT INSERTION VENTRICULAR-PERITONEAL (Right) - right   Patient Location: PACU  Anesthesia Type:General  Level of Consciousness: awake and alert   Airway and Oxygen Therapy: Patient Spontanous Breathing  Post-op Pain: mild  Post-op Assessment: Post-op Vital signs reviewed  Post-op Vital Signs: stable  Last Vitals:  Filed Vitals:   01/10/14 1800  BP: 106/68  Pulse: 107  Temp:   Resp: 26    Complications: No apparent anesthesia complications

## 2014-02-21 ENCOUNTER — Institutional Professional Consult (permissible substitution): Payer: 59 | Admitting: Neurology

## 2014-02-26 ENCOUNTER — Other Ambulatory Visit (HOSPITAL_COMMUNITY): Payer: Self-pay | Admitting: Neurosurgery

## 2014-02-28 ENCOUNTER — Encounter (HOSPITAL_COMMUNITY): Payer: Self-pay | Admitting: *Deleted

## 2014-02-28 MED ORDER — CEFAZOLIN SODIUM-DEXTROSE 2-3 GM-% IV SOLR
2.0000 g | INTRAVENOUS | Status: AC
  Administered 2014-03-01: 2 g via INTRAVENOUS
  Filled 2014-02-28: qty 50

## 2014-03-01 ENCOUNTER — Inpatient Hospital Stay (HOSPITAL_COMMUNITY): Payer: 59

## 2014-03-01 ENCOUNTER — Encounter (HOSPITAL_COMMUNITY): Payer: Self-pay | Admitting: *Deleted

## 2014-03-01 ENCOUNTER — Ambulatory Visit (HOSPITAL_COMMUNITY)
Admission: RE | Admit: 2014-03-01 | Discharge: 2014-03-02 | Disposition: A | Discharge status: Home / Self Care | Payer: 59 | Source: Ambulatory Visit | Attending: Neurosurgery | Admitting: Neurosurgery

## 2014-03-01 ENCOUNTER — Inpatient Hospital Stay (HOSPITAL_COMMUNITY): Payer: 59 | Admitting: Anesthesiology

## 2014-03-01 ENCOUNTER — Encounter (HOSPITAL_COMMUNITY)
Admission: RE | Disposition: A | Discharge status: Home / Self Care | Payer: Self-pay | Source: Ambulatory Visit | Attending: Neurosurgery

## 2014-03-01 DIAGNOSIS — Z201 Contact with and (suspected) exposure to tuberculosis: Secondary | ICD-10-CM | POA: Insufficient documentation

## 2014-03-01 DIAGNOSIS — G932 Benign intracranial hypertension: Principal | ICD-10-CM | POA: Diagnosis present

## 2014-03-01 DIAGNOSIS — Z91010 Allergy to peanuts: Secondary | ICD-10-CM | POA: Insufficient documentation

## 2014-03-01 DIAGNOSIS — K219 Gastro-esophageal reflux disease without esophagitis: Secondary | ICD-10-CM | POA: Diagnosis not present

## 2014-03-01 DIAGNOSIS — D649 Anemia, unspecified: Secondary | ICD-10-CM | POA: Insufficient documentation

## 2014-03-01 DIAGNOSIS — Z888 Allergy status to other drugs, medicaments and biological substances status: Secondary | ICD-10-CM | POA: Insufficient documentation

## 2014-03-01 DIAGNOSIS — E119 Type 2 diabetes mellitus without complications: Secondary | ICD-10-CM | POA: Insufficient documentation

## 2014-03-01 DIAGNOSIS — Z79899 Other long term (current) drug therapy: Secondary | ICD-10-CM | POA: Diagnosis not present

## 2014-03-01 HISTORY — DX: Other specified postprocedural states: R11.2

## 2014-03-01 HISTORY — PX: SHUNT REVISION VENTRICULAR-PERITONEAL: SHX6094

## 2014-03-01 HISTORY — DX: Anemia, unspecified: D64.9

## 2014-03-01 HISTORY — DX: Nausea with vomiting, unspecified: Z98.890

## 2014-03-01 LAB — BASIC METABOLIC PANEL
Anion gap: 6 (ref 5–15)
BUN: 7 mg/dL (ref 6–23)
CO2: 23 mmol/L (ref 19–32)
Calcium: 9.8 mg/dL (ref 8.4–10.5)
Chloride: 105 mmol/L (ref 96–112)
Creatinine, Ser: 0.81 mg/dL (ref 0.50–1.10)
GFR calc non Af Amer: >90 mL/min (ref >90)
GLUCOSE: 82 mg/dL (ref 70–99)
POTASSIUM: 4.2 mmol/L (ref 3.5–5.1)
Sodium: 134 mmol/L — ABNORMAL LOW (ref 135–145)

## 2014-03-01 LAB — GLUCOSE, CAPILLARY
GLUCOSE-CAPILLARY: 68 mg/dL — AB (ref 70–99)
Glucose-Capillary: 63 mg/dL — ABNORMAL LOW (ref 70–99)
Glucose-Capillary: 57 mg/dL — ABNORMAL LOW (ref 70–99)
Glucose-Capillary: 83 mg/dL (ref 70–99)
Glucose-Capillary: 120 mg/dL — ABNORMAL HIGH (ref 70–99)
Glucose-Capillary: 93 mg/dL (ref 70–99)

## 2014-03-01 LAB — CBC
HEMATOCRIT: 35.3 % — AB (ref 36.0–46.0)
HEMOGLOBIN: 11.7 g/dL — AB (ref 12.0–15.0)
MCH: 27.8 pg (ref 26.0–34.0)
MCHC: 33.1 g/dL (ref 30.0–36.0)
MCV: 83.8 fL (ref 78.0–100.0)
Platelets: 254 10*3/uL (ref 150–400)
RBC: 4.21 MIL/uL (ref 3.87–5.11)
RDW: 12.7 % (ref 11.5–15.5)
WBC: 4.9 10*3/uL (ref 4.0–10.5)

## 2014-03-01 LAB — POCT I-STAT GLUCOSE
GLUCOSE: 85 mg/dL (ref 70–99)
Operator id: 153281

## 2014-03-01 LAB — HCG, SERUM, QUALITATIVE: Preg, Serum: POSITIVE — AB

## 2014-03-01 LAB — HCG, QUANTITATIVE, PREGNANCY: hCG, Beta Chain, Quant, S: <1 m[IU]/mL (ref <5)

## 2014-03-01 SURGERY — REVISION, SHUNT, VENTRICULOPERITONEAL
Anesthesia: General | Site: Head

## 2014-03-01 MED ORDER — PANTOPRAZOLE SODIUM 40 MG PO TBEC
40.0000 mg | DELAYED_RELEASE_TABLET | Freq: Every day | ORAL | Status: DC
  Administered 2014-03-01: 40 mg via ORAL
  Filled 2014-03-01: qty 1

## 2014-03-01 MED ORDER — SENNA 8.6 MG PO TABS
1.0000 | ORAL_TABLET | Freq: Two times a day (BID) | ORAL | Status: DC
  Administered 2014-03-01 – 2014-03-02 (×2): 8.6 mg via ORAL
  Filled 2014-03-01 (×2): qty 1

## 2014-03-01 MED ORDER — DEXAMETHASONE SODIUM PHOSPHATE 4 MG/ML IJ SOLN
INTRAMUSCULAR | Status: DC | PRN
  Administered 2014-03-01: 8 mg via INTRAVENOUS

## 2014-03-01 MED ORDER — PROPOFOL 10 MG/ML IV BOLUS
INTRAVENOUS | Status: AC
  Filled 2014-03-01: qty 20

## 2014-03-01 MED ORDER — ARTIFICIAL TEARS OP OINT
TOPICAL_OINTMENT | OPHTHALMIC | Status: DC | PRN
  Administered 2014-03-01: 1 via OPHTHALMIC

## 2014-03-01 MED ORDER — LACTATED RINGERS IV SOLN
INTRAVENOUS | Status: DC | PRN
  Administered 2014-03-01 (×2): via INTRAVENOUS

## 2014-03-01 MED ORDER — ONDANSETRON HCL 4 MG/2ML IJ SOLN: 4.0000 mg | INTRAMUSCULAR | Status: DC | PRN

## 2014-03-01 MED ORDER — PROPOFOL 10 MG/ML IV BOLUS
INTRAVENOUS | Status: DC | PRN
  Administered 2014-03-01: 100 mg via INTRAVENOUS
  Administered 2014-03-01: 160 mg via INTRAVENOUS

## 2014-03-01 MED ORDER — LIDOCAINE HCL (CARDIAC) 20 MG/ML IV SOLN
INTRAVENOUS | Status: AC
  Filled 2014-03-01: qty 5

## 2014-03-01 MED ORDER — HYDROMORPHONE HCL 1 MG/ML IJ SOLN
INTRAMUSCULAR | Status: AC
  Filled 2014-03-01: qty 1

## 2014-03-01 MED ORDER — ROCURONIUM BROMIDE 50 MG/5ML IV SOLN
INTRAVENOUS | Status: AC
  Filled 2014-03-01: qty 1

## 2014-03-01 MED ORDER — ONDANSETRON HCL 4 MG PO TABS: 4.0000 mg | ORAL_TABLET | ORAL | Status: DC | PRN

## 2014-03-01 MED ORDER — LIDOCAINE HCL (CARDIAC) 20 MG/ML IV SOLN
INTRAVENOUS | Status: DC | PRN
  Administered 2014-03-01: 60 mg via INTRAVENOUS

## 2014-03-01 MED ORDER — FENTANYL CITRATE 0.05 MG/ML IJ SOLN
INTRAMUSCULAR | Status: DC | PRN
  Administered 2014-03-01: 100 ug via INTRAVENOUS
  Administered 2014-03-01: 50 ug via INTRAVENOUS
  Administered 2014-03-01: 100 ug via INTRAVENOUS

## 2014-03-01 MED ORDER — ACETAMINOPHEN 650 MG RE SUPP: 650.0000 mg | RECTAL | Status: DC | PRN

## 2014-03-01 MED ORDER — ONDANSETRON HCL 4 MG/2ML IJ SOLN
INTRAMUSCULAR | Status: AC
  Filled 2014-03-01: qty 2

## 2014-03-01 MED ORDER — MAGNESIUM CITRATE PO SOLN
1.0000 | Freq: Once | ORAL | Status: AC | PRN
  Filled 2014-03-01: qty 296

## 2014-03-01 MED ORDER — DEXTROSE 50 % IV SOLN
25.0000 mL | Freq: Once | INTRAVENOUS | Status: AC
  Administered 2014-03-01: 25 mL via INTRAVENOUS

## 2014-03-01 MED ORDER — DEXTROSE 50 % IV SOLN
INTRAVENOUS | Status: AC
Start: 2014-03-01 — End: 2014-03-01
  Administered 2014-03-01: 25 mL via INTRAVENOUS
  Filled 2014-03-01: qty 50

## 2014-03-01 MED ORDER — PROMETHAZINE HCL 25 MG/ML IJ SOLN: 6.2500 mg | INTRAMUSCULAR | Status: DC | PRN

## 2014-03-01 MED ORDER — NEOSTIGMINE METHYLSULFATE 10 MG/10ML IV SOLN
INTRAVENOUS | Status: DC | PRN
Start: 2014-03-01 — End: 2014-03-01
  Administered 2014-03-01: 5 mg via INTRAVENOUS

## 2014-03-01 MED ORDER — ACETAMINOPHEN 325 MG PO TABS: 650.0000 mg | ORAL_TABLET | ORAL | Status: DC | PRN

## 2014-03-01 MED ORDER — METFORMIN HCL 500 MG PO TABS
500.0000 mg | ORAL_TABLET | Freq: Every day | ORAL | Status: DC
  Administered 2014-03-01: 500 mg via ORAL
  Filled 2014-03-01: qty 1

## 2014-03-01 MED ORDER — LACTATED RINGERS IV SOLN
INTRAVENOUS | Status: DC
  Administered 2014-03-01: 15:00:00 via INTRAVENOUS

## 2014-03-01 MED ORDER — CEFAZOLIN SODIUM-DEXTROSE 2-3 GM-% IV SOLR
2.0000 g | Freq: Three times a day (TID) | INTRAVENOUS | Status: AC
Start: 2014-03-02 — End: 2014-03-02
  Administered 2014-03-01 – 2014-03-02 (×2): 2 g via INTRAVENOUS
  Filled 2014-03-01 (×2): qty 50

## 2014-03-01 MED ORDER — MONTELUKAST SODIUM 10 MG PO TABS
10.0000 mg | ORAL_TABLET | Freq: Every day | ORAL | Status: DC
  Administered 2014-03-01: 10 mg via ORAL
  Filled 2014-03-01: qty 1

## 2014-03-01 MED ORDER — MIDAZOLAM HCL 5 MG/5ML IJ SOLN
INTRAMUSCULAR | Status: DC | PRN
  Administered 2014-03-01: 2 mg via INTRAVENOUS

## 2014-03-01 MED ORDER — 0.9 % SODIUM CHLORIDE (POUR BTL) OPTIME
TOPICAL | Status: DC | PRN
  Administered 2014-03-01: 1000 mL

## 2014-03-01 MED ORDER — HYDROMORPHONE HCL 1 MG/ML IJ SOLN
0.2500 mg | INTRAMUSCULAR | Status: DC | PRN
  Administered 2014-03-01: 0.25 mg via INTRAVENOUS
  Administered 2014-03-01: 0.5 mg via INTRAVENOUS
  Administered 2014-03-01: 0.25 mg via INTRAVENOUS

## 2014-03-01 MED ORDER — PROMETHAZINE HCL 25 MG PO TABS
12.5000 mg | ORAL_TABLET | ORAL | Status: DC | PRN
  Administered 2014-03-01: 25 mg via ORAL
  Filled 2014-03-01: qty 1

## 2014-03-01 MED ORDER — ACETAZOLAMIDE 250 MG PO TABS
750.0000 mg | ORAL_TABLET | Freq: Three times a day (TID) | ORAL | Status: DC
  Administered 2014-03-01 – 2014-03-02 (×2): 750 mg via ORAL
  Filled 2014-03-01 (×4): qty 3

## 2014-03-01 MED ORDER — DEXAMETHASONE SODIUM PHOSPHATE 4 MG/ML IJ SOLN
INTRAMUSCULAR | Status: AC
  Filled 2014-03-01: qty 1

## 2014-03-01 MED ORDER — NALOXONE HCL 0.4 MG/ML IJ SOLN: 0.0800 mg | INTRAMUSCULAR | Status: DC | PRN

## 2014-03-01 MED ORDER — PANTOPRAZOLE SODIUM 40 MG IV SOLR: 40.0000 mg | Freq: Every day | INTRAVENOUS | Status: DC

## 2014-03-01 MED ORDER — BISACODYL 5 MG PO TBEC: 5.0000 mg | DELAYED_RELEASE_TABLET | Freq: Every day | ORAL | Status: DC | PRN

## 2014-03-01 MED ORDER — DEXTROSE 50 % IV SOLN
25.0000 mL | Freq: Once | INTRAVENOUS | Status: AC
  Administered 2014-03-01: 25 mL via INTRAVENOUS
  Filled 2014-03-01: qty 50

## 2014-03-01 MED ORDER — LABETALOL HCL 5 MG/ML IV SOLN: 10.0000 mg | INTRAVENOUS | Status: DC | PRN

## 2014-03-01 MED ORDER — POTASSIUM CHLORIDE IN NACL 20-0.9 MEQ/L-% IV SOLN
INTRAVENOUS | Status: DC
  Administered 2014-03-01: 23:00:00 via INTRAVENOUS
  Filled 2014-03-01: qty 1000

## 2014-03-01 MED ORDER — ONDANSETRON HCL 4 MG/2ML IJ SOLN
INTRAMUSCULAR | Status: AC
  Filled 2014-03-01: qty 6

## 2014-03-01 MED ORDER — ROCURONIUM BROMIDE 100 MG/10ML IV SOLN
INTRAVENOUS | Status: DC | PRN
  Administered 2014-03-01: 10 mg via INTRAVENOUS
  Administered 2014-03-01: 40 mg via INTRAVENOUS

## 2014-03-01 MED ORDER — GLYCOPYRROLATE 0.2 MG/ML IJ SOLN
INTRAMUSCULAR | Status: DC | PRN
  Administered 2014-03-01: .7 mg via INTRAVENOUS

## 2014-03-01 MED ORDER — HYDROCODONE-ACETAMINOPHEN 5-325 MG PO TABS: 1.0000 | ORAL_TABLET | Freq: Four times a day (QID) | ORAL | Status: DC | PRN

## 2014-03-01 MED ORDER — ONDANSETRON HCL 4 MG/2ML IJ SOLN
INTRAMUSCULAR | Status: DC | PRN
  Administered 2014-03-01: 4 mg via INTRAVENOUS

## 2014-03-01 MED ORDER — SENNOSIDES-DOCUSATE SODIUM 8.6-50 MG PO TABS: 1.0000 | ORAL_TABLET | Freq: Every evening | ORAL | Status: DC | PRN

## 2014-03-01 MED ORDER — FUROSEMIDE 20 MG PO TABS
20.0000 mg | ORAL_TABLET | Freq: Two times a day (BID) | ORAL | Status: DC
  Administered 2014-03-02: 20 mg via ORAL
  Filled 2014-03-01: qty 1

## 2014-03-01 MED ORDER — POTASSIUM CHLORIDE CRYS ER 10 MEQ PO TBCR
10.0000 meq | EXTENDED_RELEASE_TABLET | Freq: Every day | ORAL | Status: DC
  Administered 2014-03-02: 10 meq via ORAL
  Filled 2014-03-01: qty 1

## 2014-03-01 MED ORDER — MORPHINE SULFATE 2 MG/ML IJ SOLN: 1.0000 mg | INTRAMUSCULAR | Status: DC | PRN

## 2014-03-01 MED ORDER — HYDROCODONE-ACETAMINOPHEN 5-325 MG PO TABS
1.0000 | ORAL_TABLET | ORAL | Status: DC | PRN
  Administered 2014-03-01 – 2014-03-02 (×2): 1 via ORAL
  Filled 2014-03-01 (×2): qty 1

## 2014-03-01 SURGICAL SUPPLY — 87 items
BLADE CLIPPER SURG (BLADE) ×4 IMPLANT
BLADE SURG 10 STRL SS (BLADE) ×2 IMPLANT
BLADE SURG 11 STRL SS (BLADE) ×2 IMPLANT
BLADE SURG 15 STRL LF DISP TIS (BLADE) ×1 IMPLANT
BLADE SURG 15 STRL SS (BLADE) ×1
BOOT SUTURE AID YELLOW STND (SUTURE) IMPLANT
BRUSH SCRUB EZ 1% IODOPHOR (MISCELLANEOUS) ×2 IMPLANT
BUR ACORN 6.0 PRECISION (BURR) IMPLANT
CANISTER SUCT 3000ML (MISCELLANEOUS) ×2 IMPLANT
CATH STG VENTRICULAR ×2 IMPLANT
CLIP RANEY DISP (INSTRUMENTS) IMPLANT
CONT SPEC 4OZ CLIKSEAL STRL BL (MISCELLANEOUS) IMPLANT
CORDS BIPOLAR (ELECTRODE) ×2 IMPLANT
COVER BACK TABLE 60X90IN (DRAPES) ×4 IMPLANT
COVER MAYO STAND STRL (DRAPES) ×2 IMPLANT
DECANTER SPIKE VIAL GLASS SM (MISCELLANEOUS) ×2 IMPLANT
DRAPE INCISE IOBAN 85X60 (DRAPES) ×2 IMPLANT
DRAPE ORTHO SPLIT 77X108 STRL (DRAPES) ×1
DRAPE POUCH INSTRU U-SHP 10X18 (DRAPES) ×2 IMPLANT
DRAPE PROXIMA HALF (DRAPES) ×2 IMPLANT
DRAPE SURG ORHT 6 SPLT 77X108 (DRAPES) ×1 IMPLANT
DRSG OPSITE 4X5.5 SM (GAUZE/BANDAGES/DRESSINGS) ×2 IMPLANT
DRSG TELFA 3X8 NADH (GAUZE/BANDAGES/DRESSINGS) ×2 IMPLANT
DURAPREP 26ML APPLICATOR (WOUND CARE) IMPLANT
DURAPREP 6ML APPLICATOR 50/CS (WOUND CARE) ×2 IMPLANT
ELECT CAUTERY BLADE 6.4 (BLADE) ×2 IMPLANT
ELECT REM PT RETURN 9FT ADLT (ELECTROSURGICAL) ×2
ELECTRODE REM PT RTRN 9FT ADLT (ELECTROSURGICAL) ×1 IMPLANT
GAUZE SPONGE 4X4 16PLY XRAY LF (GAUZE/BANDAGES/DRESSINGS) ×4 IMPLANT
GLOVE BIO SURGEON STRL SZ 6.5 (GLOVE) ×2 IMPLANT
GLOVE BIO SURGEON STRL SZ7 (GLOVE) ×2 IMPLANT
GLOVE BIO SURGEON STRL SZ7.5 (GLOVE) IMPLANT
GLOVE BIO SURGEON STRL SZ8 (GLOVE) IMPLANT
GLOVE BIO SURGEON STRL SZ8.5 (GLOVE) IMPLANT
GLOVE BIOGEL M 8.0 STRL (GLOVE) IMPLANT
GLOVE ECLIPSE 6.5 STRL STRAW (GLOVE) ×2 IMPLANT
GLOVE ECLIPSE 7.0 STRL STRAW (GLOVE) IMPLANT
GLOVE ECLIPSE 7.5 STRL STRAW (GLOVE) IMPLANT
GLOVE ECLIPSE 8.0 STRL XLNG CF (GLOVE) IMPLANT
GLOVE ECLIPSE 8.5 STRL (GLOVE) IMPLANT
GLOVE EXAM NITRILE LRG STRL (GLOVE) IMPLANT
GLOVE EXAM NITRILE MD LF STRL (GLOVE) IMPLANT
GLOVE EXAM NITRILE XL STR (GLOVE) IMPLANT
GLOVE EXAM NITRILE XS STR PU (GLOVE) IMPLANT
GLOVE INDICATOR 6.5 STRL GRN (GLOVE) IMPLANT
GLOVE INDICATOR 7.0 STRL GRN (GLOVE) IMPLANT
GLOVE INDICATOR 7.5 STRL GRN (GLOVE) IMPLANT
GLOVE INDICATOR 8.0 STRL GRN (GLOVE) IMPLANT
GLOVE INDICATOR 8.5 STRL (GLOVE) IMPLANT
GLOVE OPTIFIT SS 8.0 STRL (GLOVE) IMPLANT
GLOVE SURG SS PI 6.5 STRL IVOR (GLOVE) IMPLANT
GOWN STRL REUS W/ TWL LRG LVL3 (GOWN DISPOSABLE) ×2 IMPLANT
GOWN STRL REUS W/ TWL XL LVL3 (GOWN DISPOSABLE) IMPLANT
GOWN STRL REUS W/TWL 2XL LVL3 (GOWN DISPOSABLE) IMPLANT
GOWN STRL REUS W/TWL LRG LVL3 (GOWN DISPOSABLE) ×2
GOWN STRL REUS W/TWL XL LVL3 (GOWN DISPOSABLE)
HEMOSTAT SURGICEL 2X14 (HEMOSTASIS) IMPLANT
KIT BASIN OR (CUSTOM PROCEDURE TRAY) ×2 IMPLANT
KIT ROOM TURNOVER OR (KITS) ×2 IMPLANT
MARKER SKIN DUAL TIP RULER LAB (MISCELLANEOUS) ×4 IMPLANT
MARKER SPHERE PSV REFLC 13MM (MARKER) ×4 IMPLANT
NEEDLE HYPO 25X1 1.5 SAFETY (NEEDLE) ×2 IMPLANT
NS IRRIG 1000ML POUR BTL (IV SOLUTION) ×2 IMPLANT
PACK EENT II TURBAN DRAPE (CUSTOM PROCEDURE TRAY) ×2 IMPLANT
PAD ARMBOARD 7.5X6 YLW CONV (MISCELLANEOUS) ×6 IMPLANT
PATTIES SURGICAL .5 X.5 (GAUZE/BANDAGES/DRESSINGS) IMPLANT
PENCIL BUTTON HOLSTER BLD 10FT (ELECTRODE) ×2 IMPLANT
SPONGE LAP 4X18 X RAY DECT (DISPOSABLE) ×2 IMPLANT
SPONGE SURGIFOAM ABS GEL 12-7 (HEMOSTASIS) IMPLANT
STAPLER SKIN PROX WIDE 3.9 (STAPLE) ×2 IMPLANT
STRIP CLOSURE SKIN 1/4X4 (GAUZE/BANDAGES/DRESSINGS) IMPLANT
STYLET DISP PRECALIBRATE (MISCELLANEOUS) ×2 IMPLANT
SUT BONE WAX W31G (SUTURE) ×2 IMPLANT
SUT ETHILON 3 0 PS 1 (SUTURE) ×2 IMPLANT
SUT NURALON 4 0 TR CR/8 (SUTURE) IMPLANT
SUT SILK 0 TIES 10X30 (SUTURE) ×2 IMPLANT
SUT SILK 2 0 TIES 10X30 (SUTURE) ×2 IMPLANT
SUT SILK 3 0 SH 30 (SUTURE) IMPLANT
SUT VIC AB 2-0 CT2 18 VCP726D (SUTURE) ×2 IMPLANT
SUT VIC AB 3-0 SH 8-18 (SUTURE) ×2 IMPLANT
SYR BULB 3OZ (MISCELLANEOUS) ×2 IMPLANT
SYR CONTROL 10ML LL (SYRINGE) ×2 IMPLANT
TOWEL OR 17X24 6PK STRL BLUE (TOWEL DISPOSABLE) ×2 IMPLANT
TOWEL OR 17X26 10 PK STRL BLUE (TOWEL DISPOSABLE) ×2 IMPLANT
TUBE CONNECTING 12X1/4 (SUCTIONS) ×2 IMPLANT
UNDERPAD 30X30 INCONTINENT (UNDERPADS AND DIAPERS) ×2 IMPLANT
WATER STERILE IRR 1000ML POUR (IV SOLUTION) ×2 IMPLANT

## 2014-03-01 NOTE — Anesthesia Procedure Notes (Signed)
Procedure Name: Intubation Date/Time: 03/01/2014 7:22 PM Performed by: Julianne RiceBILOTTA, Horris Speros Z Pre-anesthesia Checklist: Patient identified, Patient being monitored, Emergency Drugs available, Timeout performed and Suction available Patient Re-evaluated:Patient Re-evaluated prior to inductionOxygen Delivery Method: Circle system utilized Preoxygenation: Pre-oxygenation with 100% oxygen Intubation Type: IV induction Ventilation: Mask ventilation without difficulty Laryngoscope Size: Mac and 3 Grade View: Grade I Tube type: Oral Tube size: 7.0 mm Number of attempts: 1 Airway Equipment and Method: Stylet Placement Confirmation: ETT inserted through vocal cords under direct vision,  breath sounds checked- equal and bilateral and positive ETCO2 Secured at: 22 cm Tube secured with: Tape Dental Injury: Teeth and Oropharynx as per pre-operative assessment

## 2014-03-01 NOTE — Discharge Summary (Signed)
  Physician Discharge Summary  Patient ID: Theresa Hicks MRN: [id number] DOB/AGE: [birth year] 20 y.o.  Admit date: 03/01/2014 Discharge date: 03/01/2014  Admission Diagnoses:Elevated intracranial pressure  Discharge Diagnoses:  Active Problems:   Elevated intracranial pressure   Discharged Condition: good  Hospital Course: Theresa Hicks was taken to the operating room and had her shunt revised. The ventricular catheter was replaced with a better positioned catheter using stereotactic guidance. She is normal neurologically at discharge. Her wound is clean, dry, and without signs of infection at discharge. She is voiding, ambulating, and tolerating a regular diet.  Treatments: surgery: as above  Discharge Exam: Blood pressure 129/64, pulse 114, temperature 97.9 F (36.6 C), temperature source Oral, resp. rate 24, height 5\' 4"  (1.626 m), weight 95.255 kg (210 lb), SpO2 100 %. General appearance: alert, cooperative, appears stated age and no distress Neurologic: Alert and oriented X 3, normal strength and tone. Normal symmetric reflexes. Normal coordination and gait  Disposition: 01-Home or Self Care idiopathic intracranial hypertension    Medication List    ASK your doctor about these medications        acetaZOLAMIDE 250 MG tablet  Commonly known as:  DIAMOX  Take 750 mg by mouth 3 (three) times daily.     Ferrous Sulfate 28 MG Tabs  Take 28 mg by mouth daily.     furosemide 20 MG tablet  Commonly known as:  LASIX  Take 20 mg by mouth 2 (two) times daily.     HYDROcodone-acetaminophen 5-325 MG per tablet  Commonly known as:  NORCO  Take 1 tablet by mouth every 6 (six) hours as needed for moderate pain.     ketorolac 30 MG/ML injection  Commonly known as:  TORADOL  Inject 30 mg into the muscle every 30 (thirty) days.     levocetirizine 5 MG tablet  Commonly known as:  XYZAL  Take 5 mg by mouth every evening.     magnesium oxide 400 MG tablet  Commonly known as:   MAG-OX  Take 400 mg by mouth daily.     metFORMIN 500 MG tablet  Commonly known as:  GLUCOPHAGE  Take 500 mg by mouth at bedtime.     montelukast 10 MG tablet  Commonly known as:  SINGULAIR  Take 10 mg by mouth at bedtime.     pantoprazole 40 MG tablet  Commonly known as:  PROTONIX  Take 40 mg by mouth at bedtime.     potassium chloride 10 MEQ tablet  Commonly known as:  K-DUR,KLOR-CON  Take 10 mEq by mouth daily.         Signed: Rozelia Catapano L 03/01/2014, 9:24 PM

## 2014-03-01 NOTE — Progress Notes (Signed)
CBG=57, pt remains alert and oriented, denies feeling shaky. Dr. Jean RosenthalJackson called and informed with 1/2 amp D50 IV given as ordered. CBG=83 after IV D50. Dr. Jean RosenthalJackson also made aware of positive pregnancy test. Serum hcg quantitative added on. Pt denies being sexually active.

## 2014-03-01 NOTE — Progress Notes (Signed)
Spoke with lab regarding delay on quantitative, negative result in Epic now per [employer]. Neuro called and informed. Pt verbalized understanding of delay.

## 2014-03-01 NOTE — Op Note (Signed)
03/01/2014  9:04 PM  PATIENT:  Theresa Hicks  19 y.o. female  PRE-OPERATIVE DIAGNOSIS:  idiopathic intracranial hypertension  POST-OPERATIVE DIAGNOSIS:  idiopathic intracranial hypertension  PROCEDURE:  Procedure(s): right side SHUNT REVISION VENTRICULAR-PERITONEAL, sterotactic ventricular catheter placement(brain lab)  SURGEON: Surgeon(s): Coletta MemosKyle Anaisa Radi, MD  ASSISTANTS:none  ANESTHESIA:   general  EBL:  Total I/O In: 1400 [I.V.:1400] Out: 5 [Blood:5]  BLOOD ADMINISTERED:none  CELL SAVER GIVEN:none  COUNT:per nursing  DRAINS: none   SPECIMEN:  No Specimen  DICTATION: Theresa Hoitrica B Keltner was taken to the operating room, intubated, and placed under a general anesthetic without difficulty. After adequate anesthesia was obtained I placed her head in a three pin head holder. I then localized her to the stereotactic system. Her head was shaved just over the incision for the ventricular catheter and valve. She was prepped and draped in a sterile manner. I opened the incision with a 10 blade and exposed the rickham reservoir. I removed the catheter easily. I using the preoperative plan then placed a new ventricular catheter into the right lateral ventricle and had very good csf flow. I then attached the catheter to the rickham using a 2-0 tie. I irrigated the wound then closed using vicryls and staples. I applied a sterile dressing. I removed the head holder and she was moved to the stretcher and extubated.   PLAN OF CARE: Admit for overnight observation  PATIENT DISPOSITION:  PACU - hemodynamically stable.   Delay start of Pharmacological VTE agent (>24hrs) due to surgical blood loss or risk of bleeding:  yes

## 2014-03-01 NOTE — Anesthesia Preprocedure Evaluation (Addendum)
Anesthesia Evaluation  Patient identified by MRN, date of birth, ID band Patient awake    History of Anesthesia Complications (+) PONV  Airway Mallampati: II  TM Distance: >3 FB Neck ROM: Full    Dental   Pulmonary neg pulmonary ROS,  breath sounds clear to auscultation        Cardiovascular negative cardio ROS  Rhythm:Regular Rate:Normal     Neuro/Psych    GI/Hepatic GERD-  ,  Endo/Other  diabetesMorbid obesity  Renal/GU      Musculoskeletal   Abdominal   Peds  Hematology   Anesthesia Other Findings   Reproductive/Obstetrics (+) Pregnancy (pregnancy test today: POSITIVE) Polycystic ovarian syndrome: on metformin                            Anesthesia Physical Anesthesia Plan  ASA: III  Anesthesia Plan: General   Post-op Pain Management:    Induction: Intravenous  Airway Management Planned:   Additional Equipment:   Intra-op Plan:   Post-operative Plan: Possible Post-op intubation/ventilation  Informed Consent: I have reviewed the patients History and Physical, chart, labs and discussed the procedure including the risks, benefits and alternatives for the proposed anesthesia with the patient or authorized representative who has indicated his/her understanding and acceptance.     Plan Discussed with: CRNA, Anesthesiologist and Surgeon  Anesthesia Plan Comments:         Anesthesia Quick Evaluation

## 2014-03-01 NOTE — Transfer of Care (Signed)
Immediate Anesthesia Transfer of Care Note  Patient: Theresa Hicks  Procedure(s) Performed: Procedure(s) with comments: SHUNT REVISION VENTRICULAR-PERITONEAL (N/A) - Shunt revision  Patient Location: PACU  Anesthesia Type:General  Level of Consciousness: awake, alert , oriented and patient cooperative  Airway & Oxygen Therapy: Patient Spontanous Breathing and Patient connected to nasal cannula oxygen  Post-op Assessment: Report given to RN and Post -op Vital signs reviewed and stable  Post vital signs: Reviewed and stable  Last Vitals:  Filed Vitals:   03/01/14 1423  BP: 123/66  Pulse: 81  Temp: 36.8 C  Resp: 20    Complications: No apparent anesthesia complications

## 2014-03-01 NOTE — Progress Notes (Signed)
CBG=68, Dr. Jean RosenthalJackson aware with another 1/2 amp D50 IV ordered and given at this time.

## 2014-03-01 NOTE — H&P (Signed)
Theresa Hicks is an 19 y.o. female.   Chief Complaint: headaches HPI: Ms. Wilcher was taken to the operating room Dec, 2015 for creation of a VPshunt due to elevated ICP. While the shunt does work the catheter is in a less than optimal position. After lowering the valve pressure twice since placement without improvement in her headaches, I planned to replace the ventricular catheter and place a new one in better position.   Past Medical History  Diagnosis Date  . Environmental allergies   . 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   Marland Kitchen GERD (gastroesophageal reflux disease)   . Diabetes mellitus without complication     prediabetes, but takes Metformin for PCOS  . PCOS (polycystic ovarian syndrome)   . Headache     migraines & Intracranial hypertension   . Anemia   . PONV (postoperative nausea and vomiting)     little nausea after shunt placement    Past Surgical History  Procedure Laterality Date  . Wisdom tooth extraction  2015  . Colonoscopy      & endoscopy done at the same time as the colonoscopy  . Ventriculoperitoneal shunt Right 01/09/2014    Procedure: SHUNT INSERTION VENTRICULAR-PERITONEAL;  Surgeon: Ashok Pall, MD;  Location: [place] [facility];  Service: Neurosurgery;  Laterality: Right;  right     Family History  Problem Relation Age of Onset  . Hypertension Mother   . Arthritis/Rheumatoid Mother   . Hypertension Father    Social History:  reports that she has never smoked. She has never used smokeless tobacco. She reports that she does not drink alcohol or use illicit drugs.  Allergies:  Allergies  Allergen Reactions  . Imitrex [Sumatriptan] Anaphylaxis  . Peanuts [Peanut Oil] Anaphylaxis    Medications Prior to Admission  Medication Sig Dispense Refill  . acetaZOLAMIDE (DIAMOX) 250 MG tablet Take 750 mg by mouth 3 (three) times daily.    . Ferrous Sulfate 28 MG TABS Take 28 mg by mouth daily.    . furosemide (LASIX)  20 MG tablet Take 20 mg by mouth 2 (two) times daily.    Marland Kitchen ketorolac (TORADOL) 30 MG/ML injection Inject 30 mg into the muscle every 30 (thirty) days.    Marland Kitchen levocetirizine (XYZAL) 5 MG tablet Take 5 mg by mouth every evening.     . magnesium oxide (MAG-OX) 400 MG tablet Take 400 mg by mouth daily.    . metFORMIN (GLUCOPHAGE) 500 MG tablet Take 500 mg by mouth at bedtime.     . pantoprazole (PROTONIX) 40 MG tablet Take 40 mg by mouth at bedtime.     . potassium chloride (K-DUR,KLOR-CON) 10 MEQ tablet Take 10 mEq by mouth daily.    Marland Kitchen HYDROcodone-acetaminophen (NORCO) 5-325 MG per tablet Take 1 tablet by mouth every 6 (six) hours as needed for moderate pain. (Patient not taking: Reported on 02/26/2014) 30 tablet 0  . montelukast (SINGULAIR) 10 MG tablet Take 10 mg by mouth at bedtime.      Results for orders placed or performed during the hospital encounter of 03/01/14 (from the past 48 hour(s))  Glucose, capillary     Status: Abnormal   Collection Time: 03/01/14  2:27 PM  Result Value Ref Range   Glucose-Capillary 63 (L) 70 - 99 mg/dL  Basic metabolic panel     Status: Abnormal   Collection Time: 03/01/14  2:39 PM  Result Value Ref Range   Sodium 134 (  L) 135 - 145 mmol/L   Potassium 4.2 3.5 - 5.1 mmol/L   Chloride 105 96 - 112 mmol/L   CO2 23 19 - 32 mmol/L   Glucose, Bld 82 70 - 99 mg/dL   BUN 7 6 - 23 mg/dL   Creatinine, Ser 0.81 0.50 - 1.10 mg/dL   Calcium 9.8 8.4 - 10.5 mg/dL   GFR calc non Af Amer >90 >90 mL/min   GFR calc Af Amer >90 >90 mL/min    Comment: (NOTE) The eGFR has been calculated using the CKD EPI equation. This calculation has not been validated in all clinical situations. eGFR's persistently <90 mL/min signify possible Chronic Kidney Disease.    Anion gap 6 5 - 15  CBC     Status: Abnormal   Collection Time: 03/01/14  2:39 PM  Result Value Ref Range   WBC 4.9 4.0 - 10.5 K/uL   RBC 4.21 3.87 - 5.11 MIL/uL   Hemoglobin 11.7 (L) 12.0 - 15.0 g/dL   HCT 35.3 (L)  36.0 - 46.0 %   MCV 83.8 78.0 - 100.0 fL   MCH 27.8 26.0 - 34.0 pg   MCHC 33.1 30.0 - 36.0 g/dL   RDW 12.7 11.5 - 15.5 %   Platelets 254 150 - 400 K/uL  hCG, serum, qualitative     Status: Abnormal   Collection Time: 03/01/14  2:39 PM  Result Value Ref Range   Preg, Serum POSITIVE (A) NEGATIVE    Comment: REPEATED TO VERIFY WEAK POSITIVE        THE SENSITIVITY OF THIS METHODOLOGY IS >10 mIU/mL.   hCG, quantitative, pregnancy     Status: None   Collection Time: 03/01/14  2:39 PM  Result Value Ref Range   hCG, Beta Chain, Quant, S <1 <5 mIU/mL    Comment:          GEST. AGE      CONC.  (mIU/mL)   <=1 WEEK        5 - 50     2 WEEKS       50 - 500     3 WEEKS       100 - 10,000     4 WEEKS     1,000 - 30,000     5 WEEKS     3,500 - 115,000   6-8 WEEKS     12,000 - 270,000    12 WEEKS     15,000 - 220,000        FEMALE AND NON-PREGNANT FEMALE:     LESS THAN 5 mIU/mL REPEATED TO VERIFY REPEATED ON BOTH ANALYZERS.   Glucose, capillary     Status: Abnormal   Collection Time: 03/01/14  4:12 PM  Result Value Ref Range   Glucose-Capillary 57 (L) 70 - 99 mg/dL  Glucose, capillary     Status: None   Collection Time: 03/01/14  4:46 PM  Result Value Ref Range   Glucose-Capillary 83 70 - 99 mg/dL  Glucose, capillary     Status: Abnormal   Collection Time: 03/01/14  5:52 PM  Result Value Ref Range   Glucose-Capillary 68 (L) 70 - 99 mg/dL  Glucose, capillary     Status: Abnormal   Collection Time: 03/01/14  6:13 PM  Result Value Ref Range   Glucose-Capillary 120 (H) 70 - 99 mg/dL   Ct Head Wo Contrast  03/01/2014   CLINICAL DATA:  Elevated intracranial pressure.  Brain lab protocol.  EXAM: CT HEAD WITHOUT  CONTRAST  TECHNIQUE: Contiguous axial images were obtained from the base of the skull through the vertex without intravenous contrast.  COMPARISON:  01/10/2014  FINDINGS: Skull and Sinuses:Stable appearance of right frontal burr hole. No sinus or temporal bone effusion.  Orbits: No  flattening of the posterior sclera.  Brain: Stable positioning of a right frontal approach ventriculoperitoneal shunt catheter. Catheter tip is at the margin of the left lateral ventricular body, likely extraventricular based on coronal imaging. An additional notable finding is kinking of the tubing just distal to the pressure valve. There has been no interval displacement since prior. No ventriculomegaly or change in ventricular size. No hemorrhage, infarct, or evidence of mass lesion.  IMPRESSION: 1. Right frontal VP shunt with catheter tip at the margin of the left lateral ventricule body, likely extraventricular. 2. Chronic kink of the catheter just beyond the pressure valve. 3. Stable intracranial imaging.  No ventriculomegaly.   Electronically Signed   By: Jorje Guild M.D.   On: 03/01/2014 16:56    Review of Systems  Neurological: Positive for headaches.  All other systems reviewed and are negative.   Blood pressure 123/66, pulse 81, temperature 98.3 F (36.8 C), temperature source Oral, resp. rate 20, height '5\' 4"'  (1.626 m), weight 95.255 kg (210 lb), SpO2 100 %. Physical Exam  Vitals reviewed. Constitutional: She is oriented to person, place, and time. She appears well-developed and well-nourished.  HENT:  Head: Normocephalic and atraumatic.  Eyes: Conjunctivae and EOM are normal. Pupils are equal, round, and reactive to light.  Neck: Normal range of motion. Neck supple.  Cardiovascular: Normal rate, regular rhythm, normal heart sounds and intact distal pulses.   Respiratory: Effort normal and breath sounds normal.  GI: Soft. Bowel sounds are normal.  Musculoskeletal: Normal range of motion.  Neurological: She is alert and oriented to person, place, and time. She has normal reflexes. She displays normal reflexes. No cranial nerve deficit. She exhibits normal muscle tone. Coordination normal.  Skin: Skin is warm and dry.  Psychiatric: She has a normal mood and affect. Her behavior  is normal. Judgment and thought content normal.     Assessment/Plan Or for shunt revision.   Kaili Castille L 03/01/2014, 6:49 PM

## 2014-03-01 NOTE — Progress Notes (Signed)
Pt arrived via PACU nurse with VSS and reporting minimal pain. Will continue to monitor. Suzy Bouchardhompson, Crespin Forstrom E, [place]RN  03/01/2014  2230

## 2014-03-01 NOTE — Progress Notes (Signed)
Pt taken to CT for scan of head per Dr. Franky Machoabbell prior to going to surgery.

## 2014-03-01 NOTE — Anesthesia Postprocedure Evaluation (Signed)
  Anesthesia Post-op Note  Patient: Theresa Hicks  Procedure(s) Performed: Procedure(s) with comments: SHUNT REVISION VENTRICULAR-PERITONEAL (N/A) - Shunt revision  Patient Location: PACU  Anesthesia Type:General  Level of Consciousness: awake  Airway and Oxygen Therapy: Patient Spontanous Breathing  Post-op Pain: mild  Post-op Assessment: Post-op Vital signs reviewed  Post-op Vital Signs: Reviewed  Last Vitals:  Filed Vitals:   03/01/14 2145  BP:   Pulse: 77  Temp:   Resp: 13    Complications: No apparent anesthesia complications

## 2014-03-01 NOTE — Discharge Instructions (Signed)
You may remove the dressing on Sunday. You may was your hair on Sunday.  Call if you have a temperature greater than 101.5,  If the wound drains purulent material, if your neurologic status changes. You may eat a normal die

## 2014-03-02 DIAGNOSIS — G932 Benign intracranial hypertension: Secondary | ICD-10-CM | POA: Diagnosis not present

## 2014-03-02 NOTE — Progress Notes (Signed)
Patient is being d/c, d/c instructions given and patient verbalized understanding. 

## 2014-03-02 NOTE — Progress Notes (Signed)
Subjective: Patient reports doing fine  Objective: Vital signs in last 24 hours: Temp:  [97.9 F (36.6 C)-98.4 F (36.9 C)] 98.4 F (36.9 C) (02/06 0938) Pulse Rate:  [77-114] 102 (02/06 0938) Resp:  [12-24] 20 (02/06 0938) BP: (113-134)/(46-66) 113/46 mmHg (02/06 0938) SpO2:  [96 %-100 %] 98 % (02/06 0938) Weight:  [93.532 kg (206 lb 3.2 oz)-95.255 kg (210 lb)] 93.532 kg (206 lb 3.2 oz) (02/05 2216)  Intake/Output from previous day: 02/05 0701 - 02/06 0700 In: 1600 [I.V.:1600] Out: 1005 [Urine:1000; Blood:5] Intake/Output this shift:    Physical Exam: No headache.  Dressing CDI.  Lab Results:  Recent Labs  03/01/14 1439  WBC 4.9  HGB 11.7*  HCT 35.3*  PLT 254   BMET  Recent Labs  03/01/14 1439 03/01/14 1929  NA 134*  --   K 4.2  --   CL 105  --   CO2 23  --   GLUCOSE 82 85  BUN 7  --   CREATININE 0.81  --   CALCIUM 9.8  --     Studies/Results: Ct Head Wo Contrast  03/01/2014   CLINICAL DATA:  Elevated intracranial pressure.  Brain lab protocol.  EXAM: CT HEAD WITHOUT CONTRAST  TECHNIQUE: Contiguous axial images were obtained from the base of the skull through the vertex without intravenous contrast.  COMPARISON:  01/10/2014  FINDINGS: Skull and Sinuses:Stable appearance of right frontal burr hole. No sinus or temporal bone effusion.  Orbits: No flattening of the posterior sclera.  Brain: Stable positioning of a right frontal approach ventriculoperitoneal shunt catheter. Catheter tip is at the margin of the left lateral ventricular body, likely extraventricular based on coronal imaging. An additional notable finding is kinking of the tubing just distal to the pressure valve. There has been no interval displacement since prior. No ventriculomegaly or change in ventricular size. No hemorrhage, infarct, or evidence of mass lesion.  IMPRESSION: 1. Right frontal VP shunt with catheter tip at the margin of the left lateral ventricule body, likely extraventricular. 2.  Chronic kink of the catheter just beyond the pressure valve. 3. Stable intracranial imaging.  No ventriculomegaly.   Electronically Signed   By: Tiburcio PeaJonathan  Watts M.D.   On: 03/01/2014 16:56    Assessment/Plan: Doing well.  Discharge home.  F/U with Dr. Franky Machoabbell.    LOS: 1 day    [place] Salamone D, MD 03/02/2014, 11:20 AM

## 2014-03-04 ENCOUNTER — Encounter (HOSPITAL_COMMUNITY): Payer: Self-pay | Admitting: Neurosurgery

## 2014-04-16 ENCOUNTER — Encounter: Payer: Self-pay | Admitting: Neurology

## 2014-04-16 ENCOUNTER — Ambulatory Visit (INDEPENDENT_AMBULATORY_CARE_PROVIDER_SITE_OTHER): Payer: 59 | Admitting: Neurology

## 2014-04-16 VITALS — BP 106/63 | HR 80 | Temp 97.0°F | Ht 64.0 in | Wt 212.0 lb

## 2014-04-16 DIAGNOSIS — R5382 Chronic fatigue, unspecified: Secondary | ICD-10-CM | POA: Diagnosis not present

## 2014-04-16 DIAGNOSIS — G43009 Migraine without aura, not intractable, without status migrainosus: Secondary | ICD-10-CM

## 2014-04-16 DIAGNOSIS — G932 Benign intracranial hypertension: Secondary | ICD-10-CM | POA: Diagnosis not present

## 2014-04-16 MED ORDER — DICLOFENAC POTASSIUM(MIGRAINE) 50 MG PO PACK: 50.0000 mg | PACK | Freq: Once | ORAL | Status: DC | PRN

## 2014-04-16 NOTE — Patient Instructions (Addendum)
Overall you are doing fairly well but I do want to suggest a few things today:   Remember to drink plenty of fluid, eat healthy meals and do not skip any meals. Try to eat protein with a every meal and eat a healthy snack such as fruit or nuts in between meals. Try to keep a regular sleep-wake schedule and try to exercise daily, particularly in the form of walking, 20-30 minutes a day, if you can.   As far as your medications are concerned, I would like to suggest: Cambia at onset of headache  As far as diagnostic testing: Ophthalmology and VF testing Dr Hazle Quantigby  I would like to see you back in 4-6 months, sooner if we need to. Please call us with any interim questions, concerns, problems, updates or refill requests.   Please also call us for any test results so we can go over those with you on the phone.  My clinical assistant and will answer any of your questions and relay your messages to me and also relay most of my messages to you.   Our phone number is [phone]. We also have an after hours call service for urgent matters and there is a physician on-call for urgent questions. For any emergencies you know to call 911 or go to the nearest emergency room

## 2014-04-16 NOTE — Progress Notes (Signed)
[facility]    Provider:  Dr Lucia GaskinsAhern Referring Provider: Rolm GalaGrandis, [name], MD Primary Care Physician:  Rolm GalaGRANDIS, [name], MD  CC:  IIH  HPI:  Theresa Hicks is a 19 y.o. female here as a referral from Dr. Gavin PottersGrandis for elevated intracranial hypertension s/p VPshunt. She was on diamox 750mg  tid and lasix.  Had shunt revision in February and since then hasn't had a headache. Her vision is fine, she needs glasses. 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 Then junior year she started having changing symptoms, she had ringling in the ears, pressure on the right side. Freshman year in college she couldn't read, she was having daily headaches, for 4 years. [facility] which [facility] feel better. She would have severe headaches and double vision. She was up to 24 opening pressure. She was on high doses or diamox, nothing else helped but the lumbar punctures. Now her headaches are improved and are occasional, usually only when a storm is coming. She sometimes gets headaches around a storm. She went through a lot with her headaches, botox, DHE, trigger injections, failed multiple medications and nothing would help until the shunt. 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 These headaches re more the whole head, no nausea, +light sensitivity. Can get to a 5/10 and last 4-5 hours. Allergies to imitrex.   Reviewed notes, labs and imaging from outside physicians, which showed:  Personally reviewed CT images of the head 2016, which showed right frontal VP shunt without ventriculomegaly  Review of Systems: Patient complains of symptoms per HPI as well as the following symptoms: fatigue, headache, allergies. Pertinent negatives per HPI. All others negative.   History   Social History  . Marital Status: Single    Spouse Name: N/A  . Number of Children: 0  . Years of Education: Some  colle   Occupational History  . full time student    Social History Main Topics  . Smoking status: Never Smoker   . Smokeless tobacco: Never Used  . Alcohol Use: No  . Drug Use: No  . Sexual Activity: No   Other Topics Concern  . Not on file   Social History Narrative   Lives at home with parents and sister.    Caffeine use: Soda: 2 drinks per week       Family History  Problem Relation Age of Onset  . Hypertension Mother   . Arthritis/Rheumatoid Mother   . Hypertension Father   . Migraines Father   . Stroke Paternal Grandfather   . Diabetes Paternal Grandmother   . Diabetes Maternal Grandfather   . Colon cancer Maternal Grandfather   . Breast cancer Maternal Grandmother     Past Medical History  Diagnosis Date  . Environmental allergies   . 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   Marland Kitchen. GERD (gastroesophageal reflux disease)   . Diabetes mellitus without complication     prediabetes, but takes Metformin for PCOS  . PCOS (polycystic ovarian syndrome)   . Headache     migraines & Intracranial hypertension   . Anemia   . PONV (postoperative nausea and vomiting)     little nausea after shunt placement  . Hypothyroid     Past Surgical History  Procedure Laterality Date  . Wisdom tooth extraction  2015  . Colonoscopy      &  endoscopy done at the same time as the colonoscopy  . Ventriculoperitoneal shunt Right 01/09/2014    Procedure: SHUNT INSERTION VENTRICULAR-PERITONEAL;  Surgeon: Coletta Memos, MD;  Location: [facility];  Service: Neurosurgery;  Laterality: Right;  right   . Shunt revision ventricular-peritoneal N/A 03/01/2014    Procedure: SHUNT REVISION VENTRICULAR-PERITONEAL;  Surgeon: Coletta Memos, MD;  Location: [facility];  Service: Neurosurgery;  Laterality: N/A;  Shunt revision    Current Outpatient Prescriptions  Medication Sig Dispense Refill  . ketorolac (TORADOL) 30 MG/ML injection Inject 30 mg into the  muscle every 30 (thirty) days.    Marland Kitchen levocetirizine (XYZAL) 5 MG tablet Take 5 mg by mouth every evening.     . medroxyPROGESTERone (PROVERA) 10 MG tablet Take 10 mg by mouth daily.    . Melatonin 10 MG TABS Take 10 mg by mouth daily.    . metFORMIN (GLUCOPHAGE) 500 MG tablet Take 500 mg by mouth at bedtime.     . pantoprazole (PROTONIX) 40 MG tablet Take 40 mg by mouth at bedtime.     Marland Kitchen acetaZOLAMIDE (DIAMOX) 250 MG tablet Take 750 mg by mouth 3 (three) times daily.    . Diclofenac Potassium 50 MG PACK Take 50 mg by mouth once as needed. Take once daily as needed with headache onset. Please take with food 9 each 6  . Ferrous Sulfate 28 MG TABS Take 28 mg by mouth daily.    . furosemide (LASIX) 20 MG tablet Take 20 mg by mouth 2 (two) times daily.    Marland Kitchen HYDROcodone-acetaminophen (NORCO) 5-325 MG per tablet Take 1 tablet by mouth every 6 (six) hours as needed for moderate pain. (Patient not taking: Reported on 04/16/2014) 40 tablet 0  . magnesium oxide (MAG-OX) 400 MG tablet Take 400 mg by mouth daily.    . montelukast (SINGULAIR) 10 MG tablet Take 10 mg by mouth at bedtime.    . potassium chloride (K-DUR,KLOR-CON) 10 MEQ tablet Take 10 mEq by mouth daily.     No current facility-administered medications for this visit.    Allergies as of 04/16/2014 - Review Complete 04/16/2014  Allergen Reaction Noted  . Imitrex [sumatriptan] Anaphylaxis and Swelling 12/27/2013  . Peanuts [peanut oil] Anaphylaxis 12/27/2013  . 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 Lemon oil Swelling 04/16/2014  . Strawberry Swelling 04/16/2014    Vitals: BP 106/63 mmHg  Pulse 80  Temp(Src) 97 F (36.1 C)  Ht  (1.626 m)  Wt 212 lb (96.163 kg)  BMI 36.37 kg/m2 Last Weight:  Wt Readings from Last 1 Encounters:  04/16/14 212 lb (96.163 kg) (98 %*, Z = 2.11)   * Growth percentiles are based on CDC 2-20 Years data.   Last Height:   Ht Readings from Last 1 Encounters:  04/16/14  (1.626 m) (45 %*, Z = -0.11)    * Growth percentiles are based on CDC 2-20 Years data.   Physical exam: Exam: Gen: NAD, conversant, well nourised, obese, well groomed                     CV: RRR, no MRG. No Carotid Bruits. No peripheral edema, warm, nontender Eyes: Conjunctivae clear without exudates or hemorrhage  Neuro: Detailed Neurologic Exam  Speech:    Speech is normal; fluent and spontaneous with normal comprehension.  Cognition:    The patient is oriented to person, place, and time;     recent and remote memory intact;     language  fluent;     normal attention, concentration,     fund of knowledge Cranial Nerves:    The pupils are equal, round, and reactive to light. The fundi are normal and spontaneous venous pulsations are present. Visual fields are full to finger confrontation. Extraocular movements are intact. Trigeminal sensation is intact and the muscles of mastication are normal. The face is symmetric. The palate elevates in the midline. Hearing intact. Voice is normal. Shoulder shrug is normal. The tongue has normal motion without fasciculations.   Coordination:    Normal finger to nose and heel to shin. Normal rapid alternating movements.   Gait:    Heel-toe and tandem gait are normal.   Motor Observation:    No asymmetry, no atrophy, and no involuntary movements noted. Tone:    Normal muscle tone.    Posture:    Posture is normal. normal erect    Strength:    Strength is V/V in the upper and lower limbs.      Sensation: intact to LT     Reflex Exam:  DTR's:    Deep tendon reflexes in the upper and lower extremities are normal bilaterally.   Toes:    The toes are downgoing bilaterally.   Clonus:    Clonus is absent.    Assessment/Plan:   Theresa Hicks is a 19 y.o. female here as a referral from Dr. Gavin Potters for elevated intracranial hypertension s/p VPshunt. Headaches imrpoved, now approx montlhy. Allergic rxn to imitrex, will try Cambia as needed. Needs visual field  testing as part of workup for IIH which can cause irreversible vision loss. Will refer to Dr. Hazle Quant, ophthalmology for dilated exam and VF testing. Will order CMP to check electrolytes due to headaches. CBC and thyroid studies for chronic fatigue. Will order records from [facility].   Theresa Dean, MD  [facility] [address] [place], [place] [address]  Phone [phone] Fax [phone]

## 2014-04-17 LAB — COMPREHENSIVE METABOLIC PANEL
ALT: 13 IU/L (ref 0–32)
AST: 20 IU/L (ref 0–40)
Albumin/Globulin Ratio: 1.6 (ref 1.1–2.5)
Albumin: 4.2 g/dL (ref 3.5–5.5)
Alkaline Phosphatase: 117 IU/L (ref 39–117)
BUN/Creatinine Ratio: 14 (ref 8–20)
BUN: 12 mg/dL (ref 6–20)
Bilirubin Total: <0.2 mg/dL (ref 0.0–1.2)
CALCIUM: 9.5 mg/dL (ref 8.7–10.2)
CHLORIDE: 103 mmol/L (ref 97–108)
CO2: 24 mmol/L (ref 18–29)
Creatinine, Ser: 0.87 mg/dL (ref 0.57–1.00)
GFR calc Af Amer: 112 mL/min/{1.73_m2} (ref >59)
GFR calc non Af Amer: 97 mL/min/{1.73_m2} (ref >59)
GLOBULIN, TOTAL: 2.6 g/dL (ref 1.5–4.5)
Glucose: 86 mg/dL (ref 65–99)
Potassium: 4.3 mmol/L (ref 3.5–5.2)
SODIUM: 139 mmol/L (ref 134–144)
Total Protein: 6.8 g/dL (ref 6.0–8.5)

## 2014-04-17 LAB — THYROID PANEL WITH TSH
Free Thyroxine Index: 1.9 (ref 1.2–4.9)
T3 Uptake Ratio: 24 % (ref 24–39)
T4, Total: 8.1 ug/dL (ref 4.5–12.0)
TSH: 2.85 u[IU]/mL (ref 0.450–4.500)

## 2014-04-17 LAB — CBC
HEMATOCRIT: 35.1 % (ref 34.0–46.6)
Hemoglobin: 11.3 g/dL (ref 11.1–15.9)
MCH: 28 pg (ref 26.6–33.0)
MCHC: 32.2 g/dL (ref 31.5–35.7)
MCV: 87 fL (ref 79–97)
Platelets: 243 10*3/uL (ref 150–379)
RBC: 4.03 x10E6/uL (ref 3.77–5.28)
RDW: 14 % (ref 12.3–15.4)
WBC: 4.8 10*3/uL (ref 3.4–10.8)

## 2014-04-18 ENCOUNTER — Telehealth: Payer: Self-pay | Admitting: *Deleted

## 2014-04-18 NOTE — Telephone Encounter (Signed)
Left message for patient about normal lab results. Gave [facility] phone number and told her to call if she had any questions.

## 2014-05-18 NOTE — Discharge Summary (Signed)
PATIENT NAME:  Theresa Hicks, Galen B MR#:  [id number] DATE OF BIRTH:  1995-11-06  DATE OF ADMISSION:  04/03/2013 DATE OF DISCHARGE:  04/03/2013  ADMISSION DIAGNOSIS: Blurred vision.   DISCHARGE DIAGNOSES: 1.  Vision changes.  2.  History of migraines.  3.  History of polycystic ovary syndrome.   4.  History of hypothyroidism.   CONSULTATIONS: None.   IMAGING: MRI of the brain was negative for acute stroke.   HISTORY OF PRESENT ILLNESS: An 19 year old female with history of migraines who presented with a headache and vision changes consistent with blurred vision. For further details, please refer to the H and P.  1.  Blurred vision/vision changes. The patient says that her vision changes are only associated with reading, not images, not anything else. It is kind of a strange phenomenon. I spoke with Dr. Sherryll BurgerShah, the neurologist, who said if the MRI was negative the patient did not need other further work-up and should see a neurologist as an outpatient since she had no other focal deficits and her only main symptom was vision changes when reading but not without anything else. I also suggested she may see an ophthalmologist.  2.  History of migraine headaches, resolved. The patient sees her neurologist in [place], and I spoke with the patient's mother who is arranging followup with her outpatient neurologist. 3.  Polycystic ovary syndrome. On metformin.  4.  Hypothyroidism. The patient was continued on Synthroid.   DISCHARGE MEDICATIONS: 1.  Cyclobenzaprine 10 mg daily p.r.n.  2.  Gildess 1 tablet at bedtime.  3.  Xyzal 5 mg at bedtime.  4.  Metformin 500 b.i.d.  5.  Singulair 10 mg at bedtime.  6.  Protonix 40 mg daily.  7.  Trokendi XR 25 mg daily.  8.  Synthroid 50 mcg daily.  9.  Seroquel 25 mg t.i.d. p.r.n. migraines.  DISCHARGE DIET: Regular.   DISCHARGE ACTIVITY: As tolerated.   DISCHARGE FOLLOWUP: The patient will follow up with her neurologist as soon as available in  [place].  The patient is medically stable for discharge.  TIME SPENT: 35 minutes.  ____________________________ Janyth ContesSital P. Juliene PinaMody, MD spm:sb D: 04/03/2013 12:46:15 ET T: 04/03/2013 13:06:05 ET JOB#: [id number]  cc: Waynetta Metheny P. Juliene PinaMody, MD, <Dictator> Janyth ContesSITAL P Resa Rinks MD ELECTRONICALLY SIGNED 04/03/2013 20:47 Myla Mauriello P Chidubem Chaires MD ELECTRONICALLY SIGNED 04/03/2013 20:47

## 2014-05-18 NOTE — H&P (Signed)
PATIENT NAME:  Theresa Hicks, Theresa Hicks MR#:  [id number] DATE OF BIRTH:  Nov 24, 1995  DATE OF ADMISSION:  04/03/2013  PRIMARY CARE PHYSICIAN: Letitia CaulHeidi M. [name], MD  REFERRING PHYSICIAN: Randon Goldsmithebecca L. Lord, MD  CHIEF COMPLAINT: Blurred vision.   HISTORY OF PRESENT ILLNESS: Theresa Hicks is an 19 year old female with a history of migraine headaches, polycystic ovarian syndrome, who presented to the Emergency Department with complaints of migraine headaches that started since Thursday. Having multiple episodes. On Friday, vomited 3 times; after that, no episodes of vomiting. On Friday, when she was coming back from the school, felt the patient's legs were numb, weak, followed by started to have blurred vision. The patient called neurologist on Friday, who give her some medications; however, did not have any improvement over the weekend. Concerning this, called Monday morning. Recommended the patient to come to the Emergency Department. The patient states that still not able to read secondary to blurred vision. The patient did not have any vomiting in the Emergency Department. The patient states her nausea comes and goes. Did not have any fever. Does not have any weakness in any part of the body. Workup in the Emergency Department: CT head without contrast: No acute intracranial abnormality. Lab work is completely unremarkable.   PAST MEDICAL HISTORY: Migraine headaches and polycystic ovarian syndrome.   PAST SURGICAL HISTORY: None.   ALLERGIES: No known drug allergies.   HOME MEDICATIONS:  1. Xyzal 5 mg once a day.  2. Trokendi XR 25 mg once a day.  3. Synthroid 50 mcg once a day.  4. Singulair 10 mg once a day.  5. Seroquel 25 mg 3 times a day.  6. Protonix 40 mg once a day.  7. Metformin 500 mg 2 times a day.  8. Gildess once a day.  9. Flexeril 10 mg once a day as needed.   SOCIAL HISTORY: No history of smoking, drinking alcohol or using illicit drugs. Is a Consulting civil engineerstudent.   FAMILY HISTORY: History of  migraine headaches.   REVIEW OF SYSTEMS:  CONSTITUTIONAL: Denies any generalized weakness.  EYES: Has blurred vision.  ENT: No change in hearing.   RESPIRATORY: No cough, shortness of breath.  CARDIOVASCULAR: No chest pain, palpations.  GASTROINTESTINAL: Has nausea on and off. No diarrhea.  GENITOURINARY: No dysuria or hematuria.  SKIN: No rash or lesions.  MUSCULOSKELETAL: Good range of motion in all the extremities.  NEUROLOGIC: The patient has blurred vision; otherwise, no weakness, numbness.   PHYSICAL EXAMINATION:  GENERAL: This is a well-built, well-nourished female lying down in the bed, not in distress.  VITAL SIGNS: Temperature 98.2, pulse 84, blood pressure 127/67, respiratory rate of 16, oxygen saturation 100% on room air.  HEENT: Head normocephalic, atraumatic. Eyes: No scleral icterus. Conjunctivae normal. Pupils equal, round and reactive to light. Extraocular movements are intact. Mucous membranes moist. No pharyngeal erythema.  NECK: Supple. No lymphadenopathy. No JVD. No carotid bruit.  CHEST: Has no focal tenderness.  LUNGS: Bilaterally clear to auscultation.  HEART: S1, S2 regular. No murmurs are heard.  ABDOMEN: Bowel sounds present. Soft, nontender, nondistended. No hepatosplenomegaly. Obese abdomen.  EXTREMITIES: No pedal edema. Pulses 2+.  SKIN: No rash or lesions.  MUSCULOSKELETAL: Good range of motion in all the extremities.  NEUROLOGIC: The patient is alert, oriented to place, person and time. Cranial nerves II through XII intact. Motor 5/5 in upper and lower extremities.   LABORATORY DATA: BMP, CBC, CT head without contrast are completely unremarkable.   ASSESSMENT AND PLAN: Theresa Hicks  is an 19 year old female who comes to the Emergency Department with blurred vision.   1. Blurred vision. Does not seem to have any difficulty reading. However, the differential diagnosis related to neurological symptoms of migraine headaches versus multiple sclerosis. Admit  the patient to medical bed. Will obtain MRI of the brain. If negative, the patient could be discharged home. The patient is able to tolerate diet. Will continue with Tylenol as needed. Does not seem to be in any distress.  2. Obesity.  3. Polycystic ovarian syndrome. The patient is on metformin.  4. Keep the patient on deep vein thrombosis prophylaxis with Lovenox.   TIME SPENT: 45 minutes.   ____________________________ Susa Griffins, MD pv:lb D: 04/03/2013 02:01:54 ET T: 04/03/2013 06:07:20 ET JOB#: [id number]  cc: Susa Griffins, MD, <Dictator> Susa Griffins MD ELECTRONICALLY SIGNED 04/12/2013 4:29

## 2014-05-30 ENCOUNTER — Telehealth: Payer: Self-pay

## 2014-05-30 NOTE — Telephone Encounter (Signed)
When pt calls to reschedule appointment please schedule her with Dr. Lucia GaskinsAhern in a 15 min slot

## 2014-05-30 NOTE — Telephone Encounter (Signed)
Pt was contacted concerning rescheduling appointment  from bump list, her V/M was reached and message left.  Pt informed to give a call back at her convenience

## 2014-07-02 DIAGNOSIS — Z9289 Personal history of other medical treatment: Secondary | ICD-10-CM | POA: Insufficient documentation

## 2014-08-20 ENCOUNTER — Ambulatory Visit: Payer: 59 | Admitting: Neurology

## 2014-09-02 ENCOUNTER — Ambulatory Visit: Payer: 59 | Admitting: Neurology

## 2014-09-16 ENCOUNTER — Ambulatory Visit (INDEPENDENT_AMBULATORY_CARE_PROVIDER_SITE_OTHER): Payer: Commercial Managed Care - HMO | Admitting: Neurology

## 2014-09-16 ENCOUNTER — Encounter: Payer: Self-pay | Admitting: Neurology

## 2014-09-16 VITALS — BP 110/45 | HR 66 | Ht 64.0 in | Wt 206.2 lb

## 2014-09-16 DIAGNOSIS — G43809 Other migraine, not intractable, without status migrainosus: Secondary | ICD-10-CM

## 2014-09-16 MED ORDER — KETOROLAC TROMETHAMINE 60 MG/2ML IM SOLN
60.0000 mg | Freq: Once | INTRAMUSCULAR | Status: AC
  Administered 2014-09-16: 60 mg via INTRAMUSCULAR

## 2014-09-16 MED ORDER — TIZANIDINE HCL 4 MG PO TABS: 4.0000 mg | ORAL_TABLET | Freq: Four times a day (QID) | ORAL | Status: DC | PRN

## 2014-09-16 NOTE — Patient Instructions (Addendum)
Overall you are doing great but I do want to suggest a few things today:   Remember to drink plenty of fluid, eat healthy meals and do not skip any meals. Try to eat protein with a every meal and eat a healthy snack such as fruit or nuts in between meals. Try to keep a regular sleep-wake schedule and try to exercise daily, particularly in the form of walking, 20-30 minutes a day, if you can.   As far as your medications are concerned, I would like to suggest: Cambia at onset of headache   I would like to see you back in 6 months, sooner if we need to. Please call us with any interim questions, concerns, problems, updates or refill requests.   Our phone number is [phone]. We also have an after hours call service for urgent matters and there is a physician on-call for urgent questions. For any emergencies you know to call 911 or go to the nearest emergency room

## 2014-09-16 NOTE — Progress Notes (Signed)
Toradol injection given in Left deltoid. Used aseptic technique. Cleaned with alcohol wipe before injection. Applied band-aid. Pt stable upon leaving.

## 2014-09-16 NOTE — Progress Notes (Signed)
[id number]    Provider: Dr Lucia Gaskins Referring Provider: Rolm Gala, MD Primary Care Physician: Rolm Gala, MD  CC: IIH  Interval history 09/16/2014: Since March she has had only a few migraines. She never needed to take the Cambia. The headaches go away on their own. No daily headaches, no pressure headaches, no changes in vision. She had a migraine last night, light sensitivity, an aura. She still has the migraine today, we can try a shot of toradol in the clinic.Gave her a cambia sample and a carrying case with a water cup so she can carry it easier and take it at onset of headache.    Initial visit 04/15/2013: OLAMAE FERRARA is a 19 y.o. female here as a referral from Dr. Gavin Potters for elevated intracranial hypertension s/p VPshunt. She was on diamox 750mg  tid and lasix. Had shunt revision in February and since then hasn't had a headache. Her vision is fine, she needs glasses. 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 Then junior year she started having changing symptoms, she had ringling in the ears, pressure on the right side. Freshman year in college she couldn't read, she was having daily headaches, for 4 years. She was having LPs at [facility] which made her feel better. She would have severe headaches and double vision. She was up to 24 opening pressure. She was on high doses or diamox, nothing else helped but the lumbar punctures. Now her headaches are improved and are occasional, usually only when a storm is coming. She sometimes gets headaches around a storm. She went through a lot with her headaches, botox, DHE, trigger injections, failed multiple medications and nothing would help until the shunt. 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 These headaches re more the whole head, no nausea, +light sensitivity. Can get to a 5/10 and last 4-5 hours. Allergies to imitrex.    Reviewed notes, labs and imaging from outside physicians, which showed:  Personally reviewed CT images of the head 2016, which showed right frontal VP shunt without ventriculomegaly  Review of Systems: Patient complains of symptoms per HPI as well as the following symptoms: fatigue, light sensitivity, env allergies, food allergies, headache, frequent waking. Pertinent negatives per HPI. All others negative.   Social History   Social History  . Marital Status: Single    Spouse Name: N/A  . Number of Children: 0  . Years of Education: Some colle   Occupational History  . full time student    Social History Main Topics  . Smoking status: Never Smoker   . Smokeless tobacco: Never Used  . Alcohol Use: No  . Drug Use: No  . Sexual Activity: No   Other Topics Concern  . Not on file   Social History Narrative   Lives at home with parents and sister.    Caffeine use: Soda: 2 drinks per week       Family History  Problem Relation Age of Onset  . Hypertension Mother   . Arthritis/Rheumatoid Mother   . Hypertension Father   . Migraines Father   . Stroke Paternal Grandfather   . Diabetes Paternal Grandmother   . Diabetes Maternal Grandfather   . Colon cancer Maternal Grandfather   . Breast cancer Maternal Grandmother     Past Medical History  Diagnosis Date  . Environmental allergies   . 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   Marland Kitchen GERD (gastroesophageal reflux disease)   . Diabetes mellitus without complication     prediabetes, but takes Metformin for PCOS  . PCOS (polycystic ovarian syndrome)   . Headache     migraines & Intracranial hypertension   . Anemia   . PONV (postoperative nausea and vomiting)     little nausea after shunt placement  . Hypothyroid     Past Surgical History  Procedure Laterality Date  . Wisdom tooth extraction  2015  . Colonoscopy      & endoscopy done at the same time as the colonoscopy  .  Ventriculoperitoneal shunt Right 01/09/2014    Procedure: SHUNT INSERTION VENTRICULAR-PERITONEAL;  Surgeon: Coletta Memos, MD;  Location: [facility];  Service: Neurosurgery;  Laterality: Right;  right   . Shunt revision ventricular-peritoneal N/A 03/01/2014    Procedure: SHUNT REVISION VENTRICULAR-PERITONEAL;  Surgeon: Coletta Memos, MD;  Location: [facility];  Service: Neurosurgery;  Laterality: N/A;  Shunt revision    Current Outpatient Prescriptions  Medication Sig Dispense Refill  . EPIDUO 0.1-2.5 % gel Apply 1 application topically as needed.  0  . levocetirizine (XYZAL) 5 MG tablet Take 5 mg by mouth every evening.     . medroxyPROGESTERone (PROVERA) 10 MG tablet Take 10 mg by mouth daily.    . Melatonin 10 MG TABS Take 10 mg by mouth daily.    . metFORMIN (GLUCOPHAGE) 500 MG tablet Take 500 mg by mouth at bedtime.     . montelukast (SINGULAIR) 10 MG tablet Take 10 mg by mouth at bedtime.    . pantoprazole (PROTONIX) 40 MG tablet Take 40 mg by mouth at bedtime.     . tretinoin (RETIN-A) 0.1 % cream Apply 1 application topically as needed.  0  . acetaZOLAMIDE (DIAMOX) 250 MG tablet Take 750 mg by mouth 3 (three) times daily.    . Diclofenac Potassium 50 MG PACK Take 50 mg by mouth once as needed. Take once daily as needed with headache onset. Please take with food (Patient not taking: Reported on 09/16/2014) 9 each 6  . Ferrous Sulfate 28 MG TABS Take 28 mg by mouth daily.    . furosemide (LASIX) 20 MG tablet Take 20 mg by mouth 2 (two) times daily.    Marland Kitchen HYDROcodone-acetaminophen (NORCO) 5-325 MG per tablet Take 1 tablet by mouth every 6 (six) hours as needed for moderate pain. (Patient not taking: Reported on 04/16/2014) 40 tablet 0  . ketorolac (TORADOL) 30 MG/ML injection Inject 30 mg into the muscle every 30 (thirty) days.    . magnesium oxide (MAG-OX) 400 MG tablet Take 400 mg by mouth daily.    . potassium chloride (K-DUR,KLOR-CON) 10 MEQ tablet Take 10 mEq by mouth daily.     No  current facility-administered medications for this visit.    Allergies as of 09/16/2014 - Review Complete 09/16/2014  Allergen Reaction Noted  . Imitrex [sumatriptan] Anaphylaxis and Swelling 12/27/2013  . Peanuts [peanut oil] Anaphylaxis 12/27/2013  . 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 Lemon oil Swelling 04/16/2014  . Other Swelling 09/16/2014  . Strawberry Swelling 04/16/2014  . Estrogens Other (See Comments) 09/16/2014    Vitals: BP 110/45 mmHg  Pulse 66  Ht 5\' 4"  (1.626 m)  Wt 206 lb 3.2 oz (93.532 kg)  BMI 35.38 kg/m2 Last Weight:  Wt Readings from Last 1 Encounters:  09/16/14 206 lb 3.2 oz (93.532 kg) (98 %*, Z = 2.03)   *  Growth percentiles are based on CDC 2-20 Years data.   Last Height:   Ht Readings from Last 1 Encounters:  09/16/14  (1.626 m) (45 %*, Z = -0.12)   * Growth percentiles are based on CDC 2-20 Years data.    Speech:  Speech is normal; fluent and spontaneous with normal comprehension.  Cognition:  The patient is oriented to person, place, and time;  Cranial Nerves:  The pupils are equal, round, and reactive to light. The fundi are normal and flat. Visual fields are full to finger confrontation. Extraocular movements are intact. Trigeminal sensation is intact and the muscles of mastication are normal. The face is symmetric. The palate elevates in the midline. Hearing intact. Voice is normal. Shoulder shrug is normal. The tongue has normal motion without fasciculations.   Motor Observation:  No asymmetry, no atrophy, and no involuntary movements noted. Tone:  Normal muscle tone.   Posture:  Posture is normal. normal erect   Strength:  Strength is V/V in the upper and lower limbs.     Assessment/Plan: TAWONA FILSINGER is a 19 y.o. female here as a referral from Dr. Gavin Potters for elevated intracranial hypertension s/p VPshunt. Headaches imrpoved, now approx montlhy or less. Allergic rxn to imitrex, will try Cambia as  needed. Needs visual field testing as part of workup for IIH which can cause irreversible vision loss. Will refer to Dr. Hazle Quant, ophthalmology for dilated exam and VF testing. They will reschedule with Dr. Hazle Quant, they had to cancel appointment. F/u 6 months Naomie Dean, MD  [facility] [address] [place], [place] [address]  Phone [phone] Fax [phone]   A total of 15 minutes was spent face-to-face with this patient. Over half this time was spent on counseling patient on the IIH diagnosis and different diagnostic and therapeutic options available.

## 2014-12-02 ENCOUNTER — Emergency Department
Admission: EM | Admit: 2014-12-02 | Discharge: 2014-12-02 | Disposition: A | Discharge status: Home / Self Care | Payer: Commercial Managed Care - HMO | Attending: Emergency Medicine | Admitting: Emergency Medicine

## 2014-12-02 ENCOUNTER — Emergency Department: Payer: Commercial Managed Care - HMO

## 2014-12-02 DIAGNOSIS — Z79899 Other long term (current) drug therapy: Secondary | ICD-10-CM | POA: Insufficient documentation

## 2014-12-02 DIAGNOSIS — Z3202 Encounter for pregnancy test, result negative: Secondary | ICD-10-CM | POA: Diagnosis not present

## 2014-12-02 DIAGNOSIS — Z791 Long term (current) use of non-steroidal anti-inflammatories (NSAID): Secondary | ICD-10-CM | POA: Diagnosis not present

## 2014-12-02 DIAGNOSIS — R109 Unspecified abdominal pain: Secondary | ICD-10-CM | POA: Diagnosis not present

## 2014-12-02 DIAGNOSIS — E119 Type 2 diabetes mellitus without complications: Secondary | ICD-10-CM | POA: Diagnosis not present

## 2014-12-02 DIAGNOSIS — E669 Obesity, unspecified: Secondary | ICD-10-CM | POA: Diagnosis not present

## 2014-12-02 DIAGNOSIS — R1013 Epigastric pain: Secondary | ICD-10-CM

## 2014-12-02 DIAGNOSIS — R110 Nausea: Secondary | ICD-10-CM

## 2014-12-02 DIAGNOSIS — R197 Diarrhea, unspecified: Secondary | ICD-10-CM

## 2014-12-02 LAB — URINALYSIS COMPLETE WITH MICROSCOPIC (ARMC ONLY)
BACTERIA UA: NONE SEEN
Bilirubin Urine: NEGATIVE
GLUCOSE, UA: NEGATIVE mg/dL
Hgb urine dipstick: NEGATIVE
Ketones, ur: NEGATIVE mg/dL
Leukocytes, UA: NEGATIVE
Nitrite: NEGATIVE
PROTEIN: NEGATIVE mg/dL
Specific Gravity, Urine: 1.017 (ref 1.005–1.030)
pH: 6 (ref 5.0–8.0)

## 2014-12-02 LAB — COMPREHENSIVE METABOLIC PANEL
ALBUMIN: 4.2 g/dL (ref 3.5–5.0)
ALK PHOS: 117 U/L (ref 38–126)
ALT: 19 U/L (ref 14–54)
AST: 37 U/L (ref 15–41)
Anion gap: 7 (ref 5–15)
BUN: 7 mg/dL (ref 6–20)
CALCIUM: 9.7 mg/dL (ref 8.9–10.3)
CO2: 27 mmol/L (ref 22–32)
CREATININE: 0.74 mg/dL (ref 0.44–1.00)
Chloride: 104 mmol/L (ref 101–111)
GFR calc Af Amer: >60 mL/min (ref >60)
GFR calc non Af Amer: >60 mL/min (ref >60)
GLUCOSE: 86 mg/dL (ref 65–99)
Potassium: 4.4 mmol/L (ref 3.5–5.1)
Sodium: 138 mmol/L (ref 135–145)
TOTAL PROTEIN: 8 g/dL (ref 6.5–8.1)
Total Bilirubin: <0.1 mg/dL — ABNORMAL LOW (ref 0.3–1.2)

## 2014-12-02 LAB — CBC
HCT: 36.3 % (ref 35.0–47.0)
Hemoglobin: 12 g/dL (ref 12.0–16.0)
MCH: 28.1 pg (ref 26.0–34.0)
MCHC: 33 g/dL (ref 32.0–36.0)
MCV: 84.9 fL (ref 80.0–100.0)
Platelets: 238 10*3/uL (ref 150–440)
RBC: 4.27 MIL/uL (ref 3.80–5.20)
RDW: 13.4 % (ref 11.5–14.5)
WBC: 5.7 10*3/uL (ref 3.6–11.0)

## 2014-12-02 LAB — LIPASE, BLOOD: Lipase: 29 U/L (ref 11–51)

## 2014-12-02 LAB — POCT PREGNANCY, URINE: Preg Test, Ur: NEGATIVE

## 2014-12-02 MED ORDER — ONDANSETRON HCL 4 MG PO TABS: 4.0000 mg | ORAL_TABLET | Freq: Three times a day (TID) | ORAL | Status: DC | PRN

## 2014-12-02 MED ORDER — ONDANSETRON 4 MG PO TBDP
ORAL_TABLET | ORAL | Status: AC
  Administered 2014-12-02: 4 mg via ORAL
  Filled 2014-12-02: qty 1

## 2014-12-02 MED ORDER — ACETAMINOPHEN 500 MG PO TABS
1000.0000 mg | ORAL_TABLET | Freq: Once | ORAL | Status: AC
  Administered 2014-12-02: 1000 mg via ORAL

## 2014-12-02 MED ORDER — ONDANSETRON 4 MG PO TBDP
4.0000 mg | ORAL_TABLET | Freq: Once | ORAL | Status: AC
  Administered 2014-12-02: 4 mg via ORAL

## 2014-12-02 MED ORDER — LOPERAMIDE HCL 2 MG PO TABS: 2.0000 mg | ORAL_TABLET | Freq: Four times a day (QID) | ORAL | Status: DC | PRN

## 2014-12-02 MED ORDER — ONDANSETRON 4 MG PO TBDP
ORAL_TABLET | ORAL | Status: AC
  Filled 2014-12-02: qty 1

## 2014-12-02 MED ORDER — ACETAMINOPHEN 500 MG PO TABS
ORAL_TABLET | ORAL | Status: AC
  Administered 2014-12-02: 1000 mg via ORAL
  Filled 2014-12-02: qty 2

## 2014-12-02 NOTE — ED Notes (Signed)
Pt states increasing umbilical abd pain over the past few days with nausea, states some diaherra but no vomiting, pt still has appendix and gallbladder, pt has been able to eat and drink with no problems

## 2014-12-02 NOTE — ED Provider Notes (Signed)
[facility] Emergency Department Provider Note  ____________________________________________  Time seen: Approximately 6:19 PM  I have reviewed the triage vital signs and the nursing notes.   HISTORY  Chief Complaint Abdominal Pain    HPI Theresa Hicks is a 19 y.o. 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 Patient reports "sharp" constant pain that is there all the time. She has had nausea without vomiting. She has had 1-2 episodes of loose stool, nonbloody, daily. She has not tried anything for her pain or diarrhea. She denies any lightheadedness or shortness of breath, fever, chills, urinary symptoms, or vaginal discharge. LMP was 2 years ago as the patient has PCO S; she reports she is not sexually active.   Past Medical History  Diagnosis Date  . Environmental allergies   . 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   Marland Kitchen GERD (gastroesophageal reflux disease)   . Diabetes mellitus without complication (HCC)     prediabetes, but takes Metformin for PCOS  . PCOS (polycystic ovarian syndrome)   . Headache     migraines & Intracranial hypertension   . Anemia   . PONV (postoperative nausea and vomiting)     little nausea after shunt placement  . Hypothyroid     Patient Active Problem List   Diagnosis Date Noted  . Elevated intracranial pressure 03/01/2014  . Intracranial hypertension 01/09/2014    Past Surgical History  Procedure Laterality Date  . Wisdom tooth extraction  2015  . Colonoscopy      & endoscopy done at the same time as the colonoscopy  . Ventriculoperitoneal shunt Right 01/09/2014    Procedure: SHUNT INSERTION VENTRICULAR-PERITONEAL;  Surgeon: Coletta Memos, MD;  Location: [facility];  Service: Neurosurgery;  Laterality: Right;  right   . Shunt revision ventricular-peritoneal N/A 03/01/2014    Procedure: SHUNT REVISION VENTRICULAR-PERITONEAL;  Surgeon: Coletta Memos, MD;  Location: [facility];  Service: Neurosurgery;  Laterality: N/A;  Shunt revision    Current Outpatient Rx  Name  Route  Sig  Dispense  Refill  . Diclofenac Potassium 50 MG PACK   Oral   Take 50 mg by mouth once as needed. Take once daily as needed with headache onset. Please take with food Patient not taking: Reported on 09/16/2014   9 each   6   . EPIDUO 0.1-2.5 % gel   Topical   Apply 1 application topically as needed.      0     Dispense as written.   . Ferrous Sulfate 28 MG TABS   Oral   Take 28 mg by mouth daily.         Marland Kitchen HYDROcodone-acetaminophen (NORCO) 5-325 MG per tablet   Oral   Take 1 tablet by mouth every 6 (six) hours as needed for moderate pain. Patient not taking: Reported on 04/16/2014   40 tablet   0   . ketorolac (TORADOL) 30 MG/ML injection   Intramuscular   Inject 30 mg into the muscle every 30 (thirty) days.         Marland Kitchen levocetirizine (XYZAL) 5 MG tablet   Oral   Take 5 mg by mouth every evening.          . loperamide (IMODIUM A-D) 2 MG tablet   Oral   Take 1 tablet (2 mg total) by mouth 4 (  four) times daily as needed for diarrhea or loose stools.   12 tablet   0   . magnesium oxide (MAG-OX) 400 MG tablet   Oral   Take 400 mg by mouth daily.         . medroxyPROGESTERone (PROVERA) 10 MG tablet   Oral   Take 10 mg by mouth daily.         . Melatonin 10 MG TABS   Oral   Take 10 mg by mouth daily.         . metFORMIN (GLUCOPHAGE) 500 MG tablet   Oral   Take 500 mg by mouth at bedtime.          . montelukast (SINGULAIR) 10 MG tablet   Oral   Take 10 mg by mouth at bedtime.         . ondansetron (ZOFRAN) 4 MG tablet   Oral   Take 1 tablet (4 mg total) by mouth every 8 (eight) hours as needed for nausea or vomiting.   10 tablet   0   . pantoprazole (PROTONIX) 40 MG tablet   Oral   Take 40 mg by mouth at bedtime.          . potassium  chloride (K-DUR,KLOR-CON) 10 MEQ tablet   Oral   Take 10 mEq by mouth daily.         Marland Kitchen tiZANidine (ZANAFLEX) 4 MG tablet   Oral   Take 1 tablet (4 mg total) by mouth every 6 (six) hours as needed for muscle spasms.   30 tablet   11   . tretinoin (RETIN-A) 0.1 % cream   Topical   Apply 1 application topically as needed.      0     Allergies Imitrex; Peanuts; Citrullus vulgaris; Lemon oil; Other; Strawberry extract; and Estrogens  Family History  Problem Relation Age of Onset  . Hypertension Mother   . Arthritis/Rheumatoid Mother   . Hypertension Father   . Migraines Father   . Stroke Paternal Grandfather   . Diabetes Paternal Grandmother   . Diabetes Maternal Grandfather   . Colon cancer Maternal Grandfather   . Breast cancer Maternal Grandmother     Social History Social History  Substance Use Topics  . Smoking status: Never Smoker   . Smokeless tobacco: Never Used  . Alcohol Use: No    Review of Systems Constitutional: No fever/chills. No syncope or lightheadedness. Eyes: No visual changes. ENT: No sore throat. Cardiovascular: Denies chest pain, palpitations. Respiratory: Denies shortness of breath.  No cough. Gastrointestinal: Positive abdominal pain.  Positive nausea, no vomiting.  Positive diarrhea.  No constipation. Genitourinary: Negative for dysuria. No vaginal discharge. Musculoskeletal: Negative for back pain. Skin: Negative for rash. Neurological: Negative for headaches, focal weakness or numbness.  10-point ROS otherwise negative.  ____________________________________________   PHYSICAL EXAM:  VITAL SIGNS: ED Triage Vitals  Enc Vitals Group     BP 12/02/14 1626 112/55 mmHg     Pulse Rate 12/02/14 1626 80     Resp 12/02/14 1626 20     Temp 12/02/14 1626 98.8 F (37.1 C)     Temp Source 12/02/14 1626 Oral     SpO2 12/02/14 1626 100 %     Weight 12/02/14 1626 190 lb (86.183 kg)     Height 12/02/14 1626  (1.626 m)     Head Cir --       Peak Flow --      Pain Score 12/02/14  1636 7     Pain Loc --      Pain Edu? --      Excl. in GC? --     Constitutional: Alert and oriented. Well appearing and in no acute distress. Answer question appropriately. Eyes: Conjunctivae are normal.  EOMI. Head: Atraumatic. Nose: No congestion/rhinnorhea. Mouth/Throat: Mucous membranes are moist.  Neck: No stridor.  Supple.   Cardiovascular: Normal rate, regular rhythm. No murmurs, rubs or gallops.  Respiratory: Normal respiratory effort.  No retractions. Lungs CTAB.  No wheezes, rales or ronchi. Gastrointestinal: Abdomen is obese, soft, nondistended. She is minimally tender to palpation to the right of the umbilicus without rebound, guarding, or peritoneal signs. Musculoskeletal: No LE edema.  Neurologic:  Normal speech and language. No gross focal neurologic deficits are appreciated.  Skin:  Skin is warm, dry and intact. No rash noted. Psychiatric: Mood and affect are normal. Speech and behavior are normal.  Normal judgement  ____________________________________________   LABS (all labs ordered are listed, but only abnormal results are displayed)  Labs Reviewed  COMPREHENSIVE METABOLIC PANEL - Abnormal; Notable for the following:    Total Bilirubin <0.1 (*)    All other components within normal limits  URINALYSIS COMPLETEWITH MICROSCOPIC ([facility] ONLY) - Abnormal; Notable for the following:    Color, Urine YELLOW (*)    APPearance CLEAR (*)    Squamous Epithelial / LPF 0-5 (*)    All other components within normal limits  LIPASE, BLOOD  CBC  POCT PREGNANCY, URINE  POC URINE PREG, ED   ____________________________________________  EKG  Not indicated ____________________________________________  RADIOLOGY  Dg Chest 2 View  12/02/2014  CLINICAL DATA:  Umbilical pain for 5 days with nausea and vomiting. Initial encounter. EXAM: CHEST  2 VIEW COMPARISON:  None. FINDINGS: Ventriculostomy shunt catheter is seen coursing down  the anterior chest near the midline. Tubing is intact. Lungs are clear. Heart size is normal. No pneumothorax or pleural effusion. IMPRESSION: No acute abnormality. Ventriculostomy shunt catheter appears intact. Electronically Signed   By: Drusilla Kanner M.D.   On: 12/02/2014 19:08   Dg Abd 1 View  12/02/2014  CLINICAL DATA:  Umbilical pain x 5 days, some nausea and diarrhea, VP shunt, hx of PCOS, shielded for cxr EXAM: ABDOMEN - 1 VIEW COMPARISON:  01/31/2014 FINDINGS: VP shunt tubing noted. Nonobstructive bowel gas pattern. No abnormal opacities. IMPRESSION: Negative. Electronically Signed   By: Esperanza Heir M.D.   On: 12/02/2014 19:09    ____________________________________________   PROCEDURES  Procedure(s) performed: None  Critical Care performed: No ____________________________________________   INITIAL IMPRESSION / ASSESSMENT AND PLAN / ED COURSE  Pertinent labs & imaging results that were available during my care of the patient were reviewed by me and considered in my medical decision making (see chart for details).  19 y.o. female with a history of VP shunt terminates in the abdominal cavity presenting with several days of abdominal pain, nausea, and diarrhea. The most likely etiology is a viral syndrome or a foodborne illness. However given her VP shunt, I will also check for any evidence of malfunction or breaks. She is not having headaches I do not think that her VP shunt is having difficulty draining. Her abdominal exam is very reassuring and acute intra-abdominal surgical pathology such as appendicitis is very unlikely.  ----------------------------------------- 7:35 PM on 12/02/2014 -----------------------------------------  Patient x-ray does not show any evidence of kink or breakage of her VP shunt. There is a nonobstructive bowel gas pattern. The patient's  labs are reassuring and she is feeling better after symptomatically treatment. We'll plan discharge with close  PMD follow-up.  ____________________________________________  FINAL CLINICAL IMPRESSION(S) / ED DIAGNOSES  Final diagnoses:  Abdominal pain  Epigastric abdominal pain  Diarrhea, unspecified type  Nausea without vomiting      NEW MEDICATIONS STARTED DURING THIS VISIT:  New Prescriptions   LOPERAMIDE (IMODIUM A-D) 2 MG TABLET    Take 1 tablet (2 mg total) by mouth 4 (four) times daily as needed for diarrhea or loose stools.   ONDANSETRON (ZOFRAN) 4 MG TABLET    Take 1 tablet (4 mg total) by mouth every 8 (eight) hours as needed for nausea or vomiting.     Rockne MenghiniAnne-Caroline Garen Woolbright, MD 12/02/14 2226

## 2014-12-02 NOTE — Discharge Instructions (Signed)
You may take Tylenol for pain, Zofran for nausea, and Imodium for diarrhea. Please follow the BRAT diet until your symptoms have resolved. Please practice frequent and safe handwashing.   Please return to the emergency department if he developed worsening pain, inability to keep down food or liquid, fever, or any other symptoms concerning to you.  Abdominal Pain, Adult Many things can cause abdominal pain. Usually, abdominal pain is not caused by a disease and will improve without treatment. It can often be observed and treated at home. Your health care provider will do a physical exam and possibly order blood tests and X-rays to help determine the seriousness of your pain. However, in many cases, more time must pass before a clear cause of the pain can be found. Before that point, your health care provider may not know if you need more testing or further treatment. HOME CARE INSTRUCTIONS Monitor your abdominal pain for any changes. The following actions may help to alleviate any discomfort you are experiencing:  Only take over-the-counter or prescription medicines as directed by your health care provider.  Do not take laxatives unless directed to do so by your health care provider.  Try a clear liquid diet (broth, tea, or water) as directed by your health care provider. Slowly move to a bland diet as tolerated. SEEK MEDICAL CARE IF:  You have unexplained abdominal pain.  You have abdominal pain associated with nausea or diarrhea.  You have pain when you urinate or have a bowel movement.  You experience abdominal pain that wakes you in the night.  You have abdominal pain that is worsened or improved by eating food.  You have abdominal pain that is worsened with eating fatty foods.  You have a fever. SEEK IMMEDIATE MEDICAL CARE IF:  Your pain does not go away within 2 hours.  You keep throwing up (vomiting).  Your pain is felt only in portions of the abdomen, such as the right side  or the left lower portion of the abdomen.  You pass bloody or black tarry stools. MAKE SURE YOU:  Understand these instructions.  Will watch your condition.  Will get help right away if you are not doing well or get worse.   This information is not intended to replace advice given to you by your health care provider. Make sure you discuss any questions you have with your health care provider.   Document Released: 10/21/2004 Document Revised: 10/02/2014 Document Reviewed: 09/20/2012 Elsevier Interactive Patient Education [employer]2016 Elsevier Inc.

## 2014-12-02 NOTE — ED Notes (Signed)
Patient discharged to home per MD order. Patient in stable condition, and deemed medically cleared by ED provider for discharge. Discharge instructions reviewed with patient/family using "Teach Back"; verbalized understanding of medication education and administration, and information about follow-up care. Denies further concerns. ° °

## 2014-12-02 NOTE — ED Notes (Signed)
Pt c/o umbilical abdominal pain X 5 days, nausea. Denies vomiting or urinary symptoms. Denies fever. Pt alert and oriented X4, active, cooperative, pt in NAD. RR even and unlabored, color WNL.

## 2015-04-14 ENCOUNTER — Encounter: Payer: Self-pay | Admitting: Neurology

## 2015-04-14 ENCOUNTER — Ambulatory Visit (INDEPENDENT_AMBULATORY_CARE_PROVIDER_SITE_OTHER): Payer: Commercial Managed Care - HMO | Admitting: Neurology

## 2015-04-14 VITALS — BP 120/67 | HR 78 | Resp 20 | Ht 64.0 in | Wt 191.0 lb

## 2015-04-14 DIAGNOSIS — G43009 Migraine without aura, not intractable, without status migrainosus: Secondary | ICD-10-CM | POA: Diagnosis not present

## 2015-04-14 DIAGNOSIS — G43909 Migraine, unspecified, not intractable, without status migrainosus: Secondary | ICD-10-CM | POA: Insufficient documentation

## 2015-04-14 DIAGNOSIS — G932 Benign intracranial hypertension: Secondary | ICD-10-CM | POA: Diagnosis not present

## 2015-04-14 MED ORDER — METOCLOPRAMIDE HCL 10 MG PO TABS: 10.0000 mg | ORAL_TABLET | Freq: Three times a day (TID) | ORAL | Status: DC | PRN

## 2015-04-14 MED ORDER — TOPIRAMATE 25 MG PO TABS: 25.0000 mg | ORAL_TABLET | Freq: Every day | ORAL | Status: DC

## 2015-04-14 NOTE — Progress Notes (Addendum)
[facility]    CC: IIH  Interval history 04/14/2015: She has been having headaches. Maybe 1-2x a week. They can last a day. Started after christmas. Some times it is the whole head, pressure. Sometimes it is just behind the right eye. Cambia did not helps. No changes in vision. She saw dr. Hazle Quantigby in January or February. Will request notes from Dr. Hazle Quantigby. She has light sensitivity. Nausea. Cambia didn't help. Allergy to imitrex. Dad with migraines. She takes a zanflex which helps her sleep. Discussed triggers. She is not getting enough sleep. Not morning headaches.   Interval history 09/16/2014: Since March she has had only a few migraines. She never needed to take the Cambia. The headaches go away on their own. No daily headaches, no pressure headaches, no changes in vision. She had a migraine last night, light sensitivity, an aura. She still has the migraine today, we can try a shot of toradol in the clinic.Gave her a cambia sample and a carrying case with a water cup so she can carry it easier and take it at onset of headache.   Initial visit 04/15/2013: Theresa Hicks is a 20 y.o. female here as a referral from Dr. Gavin PottersGrandis for elevated intracranial hypertension s/p VPshunt. She was on diamox 750mg  tid and lasix. Had shunt revision in February and since then hasn't had a headache. Her vision is fine, she needs glasses. 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 Then junior year she started having changing symptoms, she had ringling in the ears, pressure on the right side. Freshman year in college she couldn't read, she was having daily headaches, for 4 years. [facility] feel better. She would have severe headaches and double vision. She was up to 24 opening pressure. She was on high doses or diamox, nothing else helped but the lumbar punctures. Now her headaches are improved and are occasional, usually only when a storm is  coming. She sometimes gets headaches around a storm. She went through a lot with her headaches, botox, DHE, trigger injections, failed multiple medications and nothing would help until the shunt. 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 These headaches re more the whole head, no nausea, +light sensitivity. Can get to a 5/10 and last 4-5 hours. Allergies to imitrex.   Reviewed notes, labs and imaging from outside physicians, which showed:  Personally reviewed CT images of the head 2016, which showed right frontal VP shunt without ventriculomegaly  Review of Systems: Patient complains of symptoms per HPI as well as the following symptoms: fatigue, light sensitivity, env allergies, food allergies, headache, frequent waking. Pertinent negatives per HPI. All others negative.  Social History   Social History  . Marital Status: Single    Spouse Name: N/A  . Number of Children: 0  . Years of Education: Some colle   Occupational History  . full time student    Social History Main Topics  . Smoking status: Never Smoker   . Smokeless tobacco: Never Used  . Alcohol Use: No  . Drug Use: No  . Sexual Activity: No   Other Topics Concern  . Not on file   Social History Narrative   Lives at home with parents and sister.    Caffeine use: Soda: 2 drinks per week       Family History  Problem Relation Age of Onset  .  Hypertension Mother   . Arthritis/Rheumatoid Mother   . Hypertension Father   . Migraines Father   . Stroke Paternal Grandfather   . Diabetes Paternal Grandmother   . Diabetes Maternal Grandfather   . Colon cancer Maternal Grandfather   . Breast cancer Maternal Grandmother     Past Medical History  Diagnosis Date  . Environmental allergies   . 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   Marland Kitchen GERD (gastroesophageal reflux disease)   . Diabetes mellitus without  complication (HCC)     prediabetes, but takes Metformin for PCOS  . PCOS (polycystic ovarian syndrome)   . Headache     migraines & Intracranial hypertension   . Anemia   . PONV (postoperative nausea and vomiting)     little nausea after shunt placement  . Hypothyroid     Past Surgical History  Procedure Laterality Date  . Wisdom tooth extraction  2015  . Colonoscopy      & endoscopy done at the same time as the colonoscopy  . Ventriculoperitoneal shunt Right 01/09/2014    Procedure: SHUNT INSERTION VENTRICULAR-PERITONEAL;  Surgeon: Coletta Memos, MD;  Location: [facility];  Service: Neurosurgery;  Laterality: Right;  right   . Shunt revision ventricular-peritoneal N/A 03/01/2014    Procedure: SHUNT REVISION VENTRICULAR-PERITONEAL;  Surgeon: Coletta Memos, MD;  Location: [facility];  Service: Neurosurgery;  Laterality: N/A;  Shunt revision    Current Outpatient Prescriptions  Medication Sig Dispense Refill  . Diclofenac Potassium 50 MG PACK Take 50 mg by mouth once as needed. Take once daily as needed with headache onset. Please take with food 9 each 6  . EPIDUO 0.1-2.5 % gel Apply 1 application topically as needed.  0  . ketorolac (TORADOL) 30 MG/ML injection Inject 30 mg into the muscle every 30 (thirty) days.    Marland Kitchen levocetirizine (XYZAL) 5 MG tablet Take 5 mg by mouth every evening.     . medroxyPROGESTERone (PROVERA) 10 MG tablet Take 10 mg by mouth daily.    . Melatonin 10 MG TABS Take 10 mg by mouth daily.    . metFORMIN (GLUCOPHAGE) 500 MG tablet Take 500 mg by mouth at bedtime.     . montelukast (SINGULAIR) 10 MG tablet Take 10 mg by mouth at bedtime.    . pantoprazole (PROTONIX) 40 MG tablet Take 40 mg by mouth at bedtime.     Marland Kitchen tiZANidine (ZANAFLEX) 4 MG tablet Take 1 tablet (4 mg total) by mouth every 6 (six) hours as needed for muscle spasms. 30 tablet 11  . tretinoin (RETIN-A) 0.1 % cream Apply 1 application topically as needed.  0   No current facility-administered  medications for this visit.    Allergies as of 04/14/2015 - Review Complete 04/14/2015  Allergen Reaction Noted  . Imitrex [sumatriptan] Anaphylaxis and Swelling 12/27/2013  . Peanuts [peanut oil] Anaphylaxis 12/27/2013  . 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 Lemon oil Swelling 04/16/2014  . Other Swelling 09/16/2014  . Strawberry extract Swelling 04/16/2014  . Estrogens Other (See Comments) 09/16/2014    Vitals: BP 120/67 mmHg  Pulse 78  Resp 20  Ht  (1.626 m)  Wt 191 lb (86.637 kg)  BMI 32.77 kg/m2 Last Weight:  Wt Readings from Last 1 Encounters:  04/14/15 191 lb (86.637 kg)   Last Height:   Ht Readings from Last 1 Encounters:  04/14/15  (1.626 m)    Cranial Nerves:  The pupils are equal, round, and reactive to light. The fundi are normal and flat. Visual fields are full to finger confrontation. Extraocular movements are intact. Trigeminal sensation is intact and the muscles of mastication are normal. The face is symmetric. The palate elevates in the midline. Hearing intact. Voice is normal. Shoulder shrug is normal. The tongue has normal motion without fasciculations.   Motor Observation:  No asymmetry, no atrophy, and no involuntary movements noted. Tone:  Normal muscle tone.   Posture:  Posture is normal. normal erect   Strength:  Strength is V/V in the upper and lower limbs.     Assessment/Plan: Theresa Hicks is a 20 y.o. female here as a referral from Dr. Gavin Potters for elevated intracranial hypertension s/p VPshunt. Headaches imrpoved, was approx montlhy or less. Recently increased in the setting of stress and decreased sleep. Discussed good sleep hygiene.  Allergic rxn to imitrex, Cambia didn't work. Needs visual field testing as part of workup for IIH which can cause irreversible vision loss. Will refer to Dr. Hazle Quant, ophthalmology for dilated exam and VF testing. She saw him, will equest notes.   - Start Topiramate  at night  and can increase to  at night. Discussed side effects. Serious side effects can inclue: Abdominal or stomach pain ,fever, chills, or sore throat , lessening of sensations or perception ,loss of appetite , mood or mental changes, including aggression, agitation, apathy, irritability, and mental depression , red, irritated, or bleeding gums , weight loss Rare Blood in the urine , decrease in sexual performance or desire , difficult or painful urination , frequent urination , hearing loss , loss of bladder control , lower back or side pain , nosebleeds , pale skin , red or irritated eyes , ringing or buzzing in the ears , skin rash or itching , swelling , trouble breathing, Common side effects of Topamax include:  tiredness, drowsiness, dizziness, nervousness, numbness or tingly feeling, coordination problems, diarrhea, weight loss, speech/language problems, changes in vision, sensory distortion, loss of appetite, bad taste in your mouth, confusion, slowed thinking, trouble concentrating or paying attention, memory problems,   - Reglan prn. Discussed serious side effects such as parkinsonism, dystonia, tardice dyskinesias, seizures, NMS, hallucinations and others. commin includes nausea, dizziness, smnolence and others. Stop for anything concerning and call office.  - Addendum review Dr. Randon Goldsmith notes for 18 2017: Vision was OD 20/20 OS 20/20 with correction intraocular pressure is normal. Slit lamp exam normal. 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 Funduscopic exam was normal, good color and sharp margins. She was diagnosed with dry eye syndrome. Patient was instructed to follow-up in 4 months with Dr. Sharmon Revere for OCT optic nerve and visual field. This is to check for comparison to previous test which showed normal findings and no damage to the optic nerve. Visual field analysis 02/20/2015 showed normal visual fields both eyes   F/u 6 months  Cc: dr.  Romilda Garret, MD  [facility] [address] [place], [place] [address]  Phone [phone] Fax [phone]   A total of 30 minutes was spent face-to-face with this patient. Over half this time was spent on counseling patient on the IIH diagnosis and different diagnostic and therapeutic options available.

## 2015-04-14 NOTE — Patient Instructions (Signed)
Remember to drink plenty of fluid, eat healthy meals and do not skip any meals. Try to eat protein with a every meal and eat a healthy snack such as fruit or nuts in between meals. Try to keep a regular sleep-wake schedule and try to exercise daily, particularly in the form of walking, 20-30 minutes a day, if you can.   As far as your medications are concerned, I would like to suggest: Topiramate 25mg  at night. May increase to 50mg  at night. Reglan at onset of headache.   I would like to see you back in 3 months, sooner if we need to. Please call us with any interim questions, concerns, problems, updates or refill requests.   Our phone number is [phone]. We also have an after hours call service for urgent matters and there is a physician on-call for urgent questions. For any emergencies you know to call 911 or go to the nearest emergency room  To prevent or relieve headaches, try the following: Cool Compress. Lie down and place a cool compress on your head.  Avoid headache triggers. If certain foods or odors seem to have triggered your migraines in the past, avoid them. A headache diary might help you identify triggers.  Include physical activity in your daily routine. Try a daily walk or other moderate aerobic exercise.  Manage stress. Find healthy ways to cope with the stressors, such as delegating tasks on your to-do list.  Practice relaxation techniques. Try deep breathing, yoga, massage and visualization.  Eat regularly. Eating regularly scheduled meals and maintaining a healthy diet might help prevent headaches. Also, drink plenty of fluids.  Follow a regular sleep schedule. Sleep deprivation might contribute to headaches Consider biofeedback. With this mind-body technique, you learn to control certain bodily functions - such as muscle tension, heart rate and blood pressure - to prevent headaches or reduce headache pain.    Proceed to emergency room if you experience new or worsening  symptoms or symptoms do not resolve, if you have new neurologic symptoms or if headache is severe, or for any concerning symptom.

## 2015-07-15 ENCOUNTER — Encounter: Payer: Self-pay | Admitting: Neurology

## 2015-07-15 ENCOUNTER — Ambulatory Visit (INDEPENDENT_AMBULATORY_CARE_PROVIDER_SITE_OTHER): Payer: Commercial Managed Care - HMO | Admitting: Neurology

## 2015-07-15 VITALS — BP 107/63 | HR 81 | Ht 64.0 in | Wt 199.8 lb

## 2015-07-15 DIAGNOSIS — G932 Benign intracranial hypertension: Secondary | ICD-10-CM | POA: Diagnosis not present

## 2015-07-15 DIAGNOSIS — G43009 Migraine without aura, not intractable, without status migrainosus: Secondary | ICD-10-CM

## 2015-07-15 MED ORDER — TOPIRAMATE 100 MG PO TABS: 100.0000 mg | ORAL_TABLET | Freq: Every day | ORAL | Status: DC

## 2015-07-15 MED ORDER — ONDANSETRON 4 MG PO TBDP: ORAL_TABLET | ORAL | Status: DC

## 2015-07-15 MED ORDER — BUTALBITAL-APAP-CAFFEINE 50-325-40 MG PO TABS: 1.0000 | ORAL_TABLET | Freq: Four times a day (QID) | ORAL | Status: DC | PRN

## 2015-07-15 NOTE — Patient Instructions (Addendum)
Overall you are doing great but I do want to suggest a few things today:   Remember to drink plenty of fluid, eat healthy meals and do not skip any meals. Try to eat protein with a every meal and eat a healthy snack such as fruit or nuts in between meals. Try to keep a regular sleep-wake schedule and try to exercise daily, particularly in the form of walking, 20-30 minutes a day, if you can.   As far as your medications are concerned, I would like to suggest: Fioricet and zofran at onset of migraine (email if it doesn't work!) Increase Topiramate to  at night  I would like to see you back in 6 months, sooner if we need to. Please call us with any interim questions, concerns, problems, updates or refill requests.   Our phone number is [phone]. We also have an after hours call service for urgent matters and there is a physician on-call for urgent questions. For any emergencies you know to call 911 or go to the nearest emergency room  Ondansetron oral dissolving tablet What is this medicine? ONDANSETRON (on DAN se tron) is used to treat nausea and vomiting caused by chemotherapy. It is also used to prevent or treat nausea and vomiting after surgery. This medicine may be used for other purposes; ask your health care provider or pharmacist if you have questions. What should I tell my health care provider before I take this medicine? They need to know if you have any of these conditions: -heart disease -history of irregular heartbeat -liver disease -low levels of magnesium or potassium in the blood -an unusual or allergic reaction to ondansetron, granisetron, other medicines, foods, dyes, or preservatives -pregnant or trying to get pregnant -breast-feeding How should I use this medicine? These tablets are made to dissolve in the mouth. Do not try to push the tablet through the foil backing. With dry hands, peel away the foil backing and gently remove the tablet. Place the tablet in the  mouth and allow it to dissolve, then swallow. While you may take these tablets with water, it is not necessary to do so. Talk to your pediatrician regarding the use of this medicine in children. Special care may be needed. Overdosage: If you think you have taken too much of this medicine contact a poison control center or emergency room at once. NOTE: This medicine is only for you. Do not share this medicine with others. What if I miss a dose? If you miss a dose, take it as soon as you can. If it is almost time for your next dose, take only that dose. Do not take double or extra doses. What may interact with this medicine? Do not take this medicine with any of the following medications: -apomorphine -certain medicines for fungal infections like fluconazole, itraconazole, ketoconazole, posaconazole, voriconazole -cisapride -dofetilide -dronedarone -pimozide -thioridazine -ziprasidone This medicine may also interact with the following medications: -carbamazepine -certain medicines for depression, anxiety, or psychotic disturbances -fentanyl -linezolid -MAOIs like Carbex, Eldepryl, Marplan, Nardil, and Parnate -methylene blue (injected into a vein) -other medicines that prolong the QT interval (cause an abnormal heart rhythm) -phenytoin -rifampicin -tramadol This list may not describe all possible interactions. Give your health care provider a list of all the medicines, herbs, non-prescription drugs, or dietary supplements you use. Also tell them if you smoke, drink alcohol, or use illegal drugs. Some items may interact with your medicine. What should I watch for while using this medicine? Check with your  doctor or health care professional as soon as you can if you have any sign of an allergic reaction. What side effects may I notice from receiving this medicine? Side effects that you should report to your doctor or health care professional as soon as possible: -allergic reactions like  skin rash, itching or hives, swelling of the face, lips, or tongue -breathing problems -confusion -dizziness -fast or irregular heartbeat -feeling faint or lightheaded, falls -fever and chills -loss of balance or coordination -seizures -sweating -swelling of the hands and feet -tightness in the chest -tremors -unusually weak or tired Side effects that usually do not require medical attention (report to your doctor or health care professional if they continue or are bothersome): -constipation or diarrhea -headache This list may not describe all possible side effects. Call your doctor for medical advice about side effects. You may report side effects to FDA at 1-800-FDA-1088. Where should I keep my medicine? Keep out of the reach of children. Store between 2 and 30 degrees C (36 and 86 degrees F). Throw away any unused medicine after the expiration date. NOTE: This sheet is a summary. It may not cover all possible information. If you have questions about this medicine, talk to your doctor, pharmacist, or health care provider.    2016, Elsevier/Gold Standard. (2012-10-18 16:21:52)     Acetaminophen; Butalbital; Caffeine tablets or capsules What is this medicine? ACETAMINOPHEN; BUTALBITAL; CAFFEINE (a set a MEE noe fen; byoo TAL bi tal; KAF een) is a pain reliever. It is used to treat tension headaches. This medicine may be used for other purposes; ask your health care provider or pharmacist if you have questions. What should I tell my health care provider before I take this medicine? They need to know if you have any of these conditions: -drug abuse or addiction -heart or circulation problems -if you often drink alcohol -kidney disease or problems going to the bathroom -liver disease -lung disease, asthma, or breathing problems -porphyria -an unusual or allergic reaction to acetaminophen, butalbital or other barbiturates, caffeine, other medicines, foods, dyes, or  preservatives -pregnant or trying to get pregnant -breast-feeding How should I use this medicine? Take this medicine by mouth with a full glass of water. Follow the directions on the prescription label. If the medicine upsets your stomach, take the medicine with food or milk. Do not take more than you are told to take. Talk to your pediatrician regarding the use of this medicine in children. Special care may be needed. Overdosage: If you think you have taken too much of this medicine contact a poison control center or emergency room at once. NOTE: This medicine is only for you. Do not share this medicine with others. What if I miss a dose? If you miss a dose, take it as soon as you can. If it is almost time for your next dose, take only that dose. Do not take double or extra doses. What may interact with this medicine? -alcohol or medicines that contain alcohol -antidepressants, especially MAOIs like isocarboxazid, phenelzine, tranylcypromine, and selegiline -antihistamines -benzodiazepines -carbamazepine -isoniazid -medicines for pain like pentazocine, buprenorphine, butorphanol, nalbuphine, tramadol, and propoxyphene -muscle relaxants -naltrexone -phenobarbital, phenytoin, and fosphenytoin -phenothiazines like perphenazine, thioridazine, chlorpromazine, mesoridazine, fluphenazine, prochlorperazine, promazine, and trifluoperazine -voriconazole This list may not describe all possible interactions. Give your health care provider a list of all the medicines, herbs, non-prescription drugs, or dietary supplements you use. Also tell them if you smoke, drink alcohol, or use illegal drugs. Some items may interact  with your medicine. What should I watch for while using this medicine? Tell your doctor or health care professional if your pain does not go away, if it gets worse, or if you have new or a different type of pain. You may develop tolerance to the medicine. Tolerance means that you will need  a higher dose of the medicine for pain relief. Tolerance is normal and is expected if you take the medicine for a long time. Do not suddenly stop taking your medicine because you may develop a severe reaction. Your body becomes used to the medicine. This does NOT mean you are addicted. Addiction is a behavior related to getting and using a drug for a non-medical reason. If you have pain, you have a medical reason to take pain medicine. Your doctor will tell you how much medicine to take. If your doctor wants you to stop the medicine, the dose will be slowly lowered over time to avoid any side effects. You may get drowsy or dizzy when you first start taking the medicine or change doses. Do not drive, use machinery, or do anything that may be dangerous until you know how the medicine affects you. Stand or sit up slowly. Do not take other medicines that contain acetaminophen with this medicine. Always read labels carefully. If you have questions, ask your doctor or pharmacist. If you take too much acetaminophen get medical help right away. Too much acetaminophen can be very dangerous and cause liver damage. Even if you do not have symptoms, it is important to get help right away. What side effects may I notice from receiving this medicine? Side effects that you should report to your doctor or health care professional as soon as possible: -allergic reactions like skin rash, itching or hives, swelling of the face, lips, or tongue -breathing problems -confusion -feeling faint or lightheaded, falls -redness, blistering, peeling or loosening of the skin, including inside the mouth -seizure -stomach pain -yellowing of the eyes or skin Side effects that usually do not require medical attention (report to your doctor or health care professional if they continue or are bothersome): -constipation -nausea, vomiting This list may not describe all possible side effects. Call your doctor for medical advice about  side effects. You may report side effects to FDA at 1-800-FDA-1088. Where should I keep my medicine? Keep out of the reach of children. This medicine can be abused. Keep your medicine in a safe place to protect it from theft. Do not share this medicine with anyone. Selling or giving away this medicine is dangerous and against the law. This medicine may cause accidental overdose and death if it taken by other adults, children, or pets. Mix any unused medicine with a substance like cat litter or coffee grounds. Then throw the medicine away in a sealed container like a sealed bag or a coffee can with a lid. Do not use the medicine after the expiration date. Store at room temperature between 15 and 30 degrees C (59 and 86 degrees F). NOTE: This sheet is a summary. It may not cover all possible information. If you have questions about this medicine, talk to your doctor, pharmacist, or health care provider.    2016, Elsevier/Gold Standard. (2013-03-09 15:00:25)

## 2015-07-15 NOTE — Progress Notes (Signed)
[facility]    Provider:  Dr Lucia GaskinsAhern Referring Provider: Rolm GalaGrandis, [name], MD Primary Care Physician:  Rolm GalaGRANDIS, [name], MD  CC: IIH  Interval history 07/15/2015:   On Topiramate. Tried Cambia and reglan for prn. On zanaflex. Has migraines since the age of 20 and also IIH s/p shunting. She is having 1-2 migraines a week. She has a little tingling. The headaches are around the right eyes, light sensitive, sound sensitive. They are lasting a day. Sleep helps. She is allergic to imitrex, throat swelled up. She gets some nausea with the headaches. Zanaflex helps with the migraines. For acute management with try fioricet and zofran.  Interval history 04/14/2015: She has been having headaches. Maybe 1-2x a week. They can last a day. Started after christmas. Some times it is the whole head, pressure. Sometimes it is just behind the right eye. Cambia did not helps. No changes in vision. She saw dr. Hazle Quantigby in January or February. Will request notes from Dr. Hazle Quantigby. She has light sensitivity. Nausea. Cambia didn't help. Allergy to imitrex. Dad with migraines. She takes a zanflex which helps her sleep. Discussed triggers. She is not getting enough sleep. Not morning headaches.   Interval history 09/16/2014: Since March she has had only a few migraines. She never needed to take the Cambia. The headaches go away on their own. No daily headaches, no pressure headaches, no changes in vision. She had a migraine last night, light sensitivity, an aura. She still has the migraine today, we can try a shot of toradol in the clinic.Gave her a cambia sample and a carrying case with a water cup so she can carry it easier and take it at onset of headache.   Initial visit 04/15/2013: Theresa Hicks is a 20 y.o. female here as a referral from Dr. Gavin PottersGrandis for elevated intracranial hypertension s/p VPshunt. She was on diamox 750mg  tid and lasix. Had shunt revision in February and since then hasn't had a  headache. Her vision is fine, she needs glasses. 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 Then junior year she started having changing symptoms, she had ringling in the ears, pressure on the right side. Freshman year in college she couldn't read, she was having daily headaches, for 4 years. [facility] which made her feel better. She would have severe headaches and double vision. She was up to 24 opening pressure. She was on high doses or diamox, nothing else helped but the lumbar punctures. Now her headaches are improved and are occasional, usually only when a storm is coming. She sometimes gets headaches around a storm. She went through a lot with her headaches, botox, DHE, trigger injections, failed multiple medications and nothing would help until the shunt. 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 These headaches re more the whole head, no nausea, +light sensitivity. Can get to a 5/10 and last 4-5 hours. Allergies to imitrex.   Reviewed notes, labs and imaging from outside physicians, which showed:  Personally reviewed CT images of the head 2016, which showed right frontal VP shunt without ventriculomegaly  Review of Systems: Patient complains of symptoms per HPI as well as the following symptoms: fatigue, light sensitivity, env allergies, food allergies, headache, frequent waking. Pertinent negatives per HPI. All others negative.    Social History   Social History  . Marital Status: Single    Spouse Name: N/A  .  Number of Children: 0  . Years of Education: Some colle   Occupational History  . full time student    Social History Main Topics  . Smoking status: Never Smoker   . Smokeless tobacco: Never Used  . Alcohol Use: No  . Drug Use: No  . Sexual Activity: No   Other Topics Concern  . Not on file   Social History Narrative   Lives at home with parents and sister.      Caffeine use: Soda: 2 drinks per week       Family History  Problem Relation Age of Onset  . Hypertension Mother   . Arthritis/Rheumatoid Mother   . Hypertension Father   . Migraines Father   . Stroke Paternal Grandfather   . Diabetes Paternal Grandmother   . Diabetes Maternal Grandfather   . Colon cancer Maternal Grandfather   . Breast cancer Maternal Grandmother     Past Medical History  Diagnosis Date  . Environmental allergies   . 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   Marland Kitchen GERD (gastroesophageal reflux disease)   . Diabetes mellitus without complication (HCC)     prediabetes, but takes Metformin for PCOS  . PCOS (polycystic ovarian syndrome)   . Headache     migraines & Intracranial hypertension   . Anemia   . PONV (postoperative nausea and vomiting)     little nausea after shunt placement  . Hypothyroid     Past Surgical History  Procedure Laterality Date  . Wisdom tooth extraction  2015  . Colonoscopy      & endoscopy done at the same time as the colonoscopy  . Ventriculoperitoneal shunt Right 01/09/2014    Procedure: SHUNT INSERTION VENTRICULAR-PERITONEAL;  Surgeon: Coletta Memos, MD;  Location: [facility];  Service: Neurosurgery;  Laterality: Right;  right   . Shunt revision ventricular-peritoneal N/A 03/01/2014    Procedure: SHUNT REVISION VENTRICULAR-PERITONEAL;  Surgeon: Coletta Memos, MD;  Location: [facility];  Service: Neurosurgery;  Laterality: N/A;  Shunt revision    Current Outpatient Prescriptions  Medication Sig Dispense Refill  . Diclofenac Potassium 50 MG PACK Take 50 mg by mouth once as needed. Take once daily as needed with headache onset. Please take with food 9 each 6  . EPIDUO 0.1-2.5 % gel Apply 1 application topically as needed.  0  . ketorolac (TORADOL) 30 MG/ML injection Inject 30 mg into the muscle every 30 (thirty) days.    Marland Kitchen levocetirizine (XYZAL) 5 MG tablet Take 5 mg by mouth every evening.      . medroxyPROGESTERone (PROVERA) 10 MG tablet Take 10 mg by mouth daily.    . Melatonin 10 MG TABS Take 10 mg by mouth daily.    . metoCLOPramide (REGLAN) 10 MG tablet Take 1 tablet (10 mg total) by mouth 3 (three) times daily as needed. For migraine or headache. 15 tablet 12  . montelukast (SINGULAIR) 10 MG tablet Take 10 mg by mouth at bedtime.    . pantoprazole (PROTONIX) 40 MG tablet Take 40 mg by mouth at bedtime.     Marland Kitchen tiZANidine (ZANAFLEX) 4 MG tablet Take 1 tablet (4 mg total) by mouth every 6 (six) hours as needed for muscle spasms. 30 tablet 11  . topiramate (TOPAMAX) 25 MG tablet Take 1 tablet (25 mg total) by mouth at bedtime. In 2 weeks may increase to take 2 at night if no side effects. 60 tablet 12  .  tretinoin (RETIN-A) 0.1 % cream Apply 1 application topically as needed.  0   No current facility-administered medications for this visit.    Allergies as of 07/15/2015 - Review Complete 07/15/2015  Allergen Reaction Noted  . Imitrex [sumatriptan] Anaphylaxis and Swelling 12/27/2013  . Peanuts [peanut oil] Anaphylaxis 12/27/2013  . 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 Lemon oil Swelling 04/16/2014  . Other Swelling 09/16/2014  . Strawberry extract Swelling 04/16/2014  . Estrogens Other (See Comments) 09/16/2014    Vitals: BP 107/63 mmHg  Pulse 81  Ht 5\' 4"  (1.626 m)  Wt 199 lb 12.8 oz (90.629 kg)  BMI 34.28 kg/m2 Last Weight:  Wt Readings from Last 1 Encounters:  07/15/15 199 lb 12.8 oz (90.629 kg)   Last Height:   Ht Readings from Last 1 Encounters:  07/15/15 5\' 4"  (1.626 m)    Cranial Nerves:  The pupils are equal, round, and reactive to light. The fundi are normal and flat. Visual fields are full to finger confrontation. Extraocular movements are intact. Trigeminal sensation is intact and the muscles of mastication are normal. The face is symmetric. The palate elevates in the midline. Hearing intact. Voice is normal. Shoulder shrug is normal. The tongue  has normal motion without fasciculations.   Motor Observation:  No asymmetry, no atrophy, and no involuntary movements noted. Tone:  Normal muscle tone.   Posture:  Posture is normal. normal erect   Strength:  Strength is V/V in the upper and lower limbs.     Assessment/Plan: Theresa Hicks is a 20 y.o. female here as a referral from Dr. Gavin Potters for elevated intracranial hypertension s/p VPshunt. Headaches imrpoved, was approx montlhy or less. Recently increased in the setting of stress and decreased sleep. Discussed good sleep hygiene. Allergic rxn to imitrex, Cambia didn't work. Needs visual field testing as part of workup for IIH which can cause irreversible vision loss. Will refer to Dr. Hazle Quant, ophthalmology for dilated exam and VF testing. She saw him, will equest notes.   - Topiramate: increase to 100mg  qhs do not get pregnant, teratogenicity,discussed.    - Reglan prn. Did not work. Cambia did not work prn. Had breathing and throat swelling with imitex (??) and never tried it again. Can try fioricet (very limited) and zofran at onset. Discussed side effects as detaile din AVS.   To prevent or relieve headaches, try the following: Cool Compress. Lie down and place a cool compress on your head.  Avoid headache triggers. If certain foods or odors seem to have triggered your migraines in the past, avoid them. A headache diary might help you identify triggers.  Include physical activity in your daily routine. Try a daily walk or other moderate aerobic exercise.  Manage stress. Find healthy ways to cope with the stressors, such as delegating tasks on your to-do list.  Practice relaxation techniques. Try deep breathing, yoga, massage and visualization.  Eat regularly. Eating regularly scheduled meals and maintaining a healthy diet might help prevent headaches. Also, drink plenty of fluids.  Follow a regular sleep schedule. Sleep deprivation might contribute to  headaches Consider biofeedback. With this mind-body technique, you learn to control certain bodily functions - such as muscle tension, heart rate and blood pressure - to prevent headaches or reduce headache pain.    Proceed to emergency room if you experience new or worsening symptoms or symptoms do not resolve, if you have new neurologic symptoms or if headache is severe, or for any concerning symptom.    -  Addendum review Dr. Randon Goldsmith notes for 18 2017: Vision was OD 20/20 OS 20/20 with correction intraocular pressure is normal. Slit lamp exam normal. 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 Funduscopic exam was normal, good color and sharp margins. She was diagnosed with dry eye syndrome. Patient was instructed to follow-up in 4 months with Dr. Sharmon Revere for OCT optic nerve and visual field. This is to check for comparison to previous test which showed normal findings and no damage to the optic nerve. Visual field analysis 02/20/2015 showed normal visual fields both eyes   F/u 6 months  Cc: Dr. Gavin Potters and Dr. Scarlette Calico, MD  [facility] [address] [place], [place] [address]  Phone [phone] Fax [phone]  A total of 30 minutes was spent face-to-face with this patient. Over half this time was spent on counseling patient on the IIH and migraine diagnosis and different diagnostic and therapeutic options available.

## 2015-11-16 ENCOUNTER — Other Ambulatory Visit: Payer: Self-pay | Admitting: Neurology

## 2015-11-17 NOTE — Telephone Encounter (Signed)
Dr Lucia GaskinsAhern- are you ok with refilling this? Pt last seen 07/15/2015. No mention of it in last OV note. Rx last given 09/16/2014 with 11 refills.

## 2016-01-14 ENCOUNTER — Ambulatory Visit (INDEPENDENT_AMBULATORY_CARE_PROVIDER_SITE_OTHER): Payer: Commercial Managed Care - HMO | Admitting: Neurology

## 2016-01-14 ENCOUNTER — Encounter: Payer: Self-pay | Admitting: Neurology

## 2016-01-14 VITALS — BP 120/70 | HR 64 | Ht 64.0 in | Wt 210.0 lb

## 2016-01-14 DIAGNOSIS — G932 Benign intracranial hypertension: Secondary | ICD-10-CM | POA: Diagnosis not present

## 2016-01-14 MED ORDER — TOPIRAMATE 25 MG PO TABS: 50.0000 mg | ORAL_TABLET | Freq: Every day | ORAL | 12 refills | Status: DC

## 2016-01-14 MED ORDER — TOPIRAMATE 100 MG PO TABS: 100.0000 mg | ORAL_TABLET | Freq: Every day | ORAL | 12 refills | Status: DC

## 2016-01-14 NOTE — Progress Notes (Signed)
[facility]    Provider:  Dr Lucia Gaskins Referring Provider: Rolm Gala, MD Primary Care Physician:  Rolm Gala, MD  CC: IIH  Interval history 07/15/2015:   On Topiramate. Tried Cambia and reglan for prn. On zanaflex. Has migraines since the age of 45 and also IIH s/p shunting. She is having 1-2 migraines a week. She has a little tingling. The headaches are around the right eyes, light sensitive, sound sensitive. They are lasting a day. Sleep helps. She is allergic to imitrex, throat swelled up. She gets some nausea with the headaches. Zanaflex helps with the migraines. For acute management with try fioricet and zofran. She has some tingling in the hands. No vision changes. She last saw Dr. Hazle Quant in January and has an upcoming appointment. No problems with vision. Headaches are once a week. The headaches start slowly and build up, the zanaflex helps, allergy to imitrex, fioricet did not work, Human resources officer did not work nor did Engineer, mining. Zanaflex helps.   Interval history 04/14/2015: She has been having headaches. Maybe 1-2x a week. They can last a day. Started after christmas. Some times it is the whole head, pressure. Sometimes it is just behind the right eye. Cambia did not helps. No changes in vision. She saw dr. Hazle Quant in January or February. Will request notes from Dr. Hazle Quant. She has light sensitivity. Nausea. Cambia didn't help. Allergy to imitrex. Dad with migraines. She takes a zanflex which helps her sleep. Discussed triggers. She is not getting enough sleep. Not morning headaches.   Interval history 09/16/2014: Since March she has had only a few migraines. She never needed to take the Cambia. The headaches go away on their own. No daily headaches, no pressure headaches, no changes in vision. She had a migraine last night, light sensitivity, an aura. She still has the migraine today, we can try a shot of toradol in the clinic.Gave her a cambia sample and a carrying case  with a water cup so she can carry it easier and take it at onset of headache.   Initial visit 04/15/2013: Theresa Hicks is a 20 y.o. female here as a referral from Dr. Gavin Potters for elevated intracranial hypertension s/p VPshunt. She was on diamox 750mg  tid and lasix. Had shunt revision in February and since then hasn't had a headache. Her vision is fine, she needs glasses. 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 Then junior year she started having changing symptoms, she had ringling in the ears, pressure on the right side. Freshman year in college she couldn't read, she was having daily headaches, for 4 years. She was having LPs at [facility] which made her feel better. She would have severe headaches and double vision. She was up to 24 opening pressure. She was on high doses or diamox, nothing else helped but the lumbar punctures. Now her headaches are improved and are occasional, usually only when a storm is coming. She sometimes gets headaches around a storm. She went through a lot with her headaches, botox, DHE, trigger injections, failed multiple medications and nothing would help until the shunt. 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 These headaches re more the whole head, no nausea, +light sensitivity. Can get to a 5/10 and last 4-5 hours. Allergies to imitrex.   Reviewed notes, labs and imaging from outside physicians, which showed:  Personally reviewed CT images of the head 2016, which showed  right frontal VP shunt without ventriculomegaly  Review of Systems: Patient complains of symptoms per HPI as well as the following symptoms: fatigue, light sensitivity, env allergies, food allergies, headache, frequent waking. Pertinent negatives per HPI. All others negative.   Social History   Social History  . Marital status: Single    Spouse name: N/A  . Number of children: 0  . Years of education:  Some colle   Occupational History  . full time student    Social History Main Topics  . Smoking status: Never Smoker  . Smokeless tobacco: Never Used  . Alcohol use No  . Drug use: No  . Sexual activity: No   Other Topics Concern  . Not on file   Social History Narrative   Lives at home with parents and sister.    Caffeine use: Soda: 2 drinks per week       Family History  Problem Relation Age of Onset  . Hypertension Mother   . Arthritis/Rheumatoid Mother   . Hypertension Father   . Migraines Father   . Stroke Paternal Grandfather   . Diabetes Paternal Grandmother   . Diabetes Maternal Grandfather   . Colon cancer Maternal Grandfather   . Breast cancer Maternal Grandmother     Past Medical History:  Diagnosis Date  . Anemia   . Diabetes mellitus without complication (HCC)    prediabetes, but takes Metformin for PCOS  . Environmental allergies   . GERD (gastroesophageal reflux disease)   . Headache    migraines & Intracranial hypertension   . Hypothyroid   . PCOS (polycystic ovarian syndrome)   . PONV (postoperative nausea and vomiting)    little nausea after shunt placement  . 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     Past Surgical History:  Procedure Laterality Date  . COLONOSCOPY     & endoscopy done at the same time as the colonoscopy  . SHUNT REVISION VENTRICULAR-PERITONEAL N/A 03/01/2014   Procedure: SHUNT REVISION VENTRICULAR-PERITONEAL;  Surgeon: Coletta Memos, MD;  Location: [facility];  Service: Neurosurgery;  Laterality: N/A;  Shunt revision  . VENTRICULOPERITONEAL SHUNT Right 01/09/2014   Procedure: SHUNT INSERTION VENTRICULAR-PERITONEAL;  Surgeon: Coletta Memos, MD;  Location: [facility];  Service: Neurosurgery;  Laterality: Right;  right   . WISDOM TOOTH EXTRACTION  2015    Current Outpatient Prescriptions  Medication Sig Dispense Refill  . butalbital-acetaminophen-caffeine (FIORICET, ESGIC)  50-325-40 MG tablet Take 1 tablet by mouth every 6 (six) hours as needed for headache. 12 tablet 5  . Diclofenac Potassium 50 MG PACK Take 50 mg by mouth once as needed. Take once daily as needed with headache onset. Please take with food 9 each 6  . EPIDUO 0.1-2.5 % gel Apply 1 application topically as needed.  0  . ketorolac (TORADOL) 30 MG/ML injection Inject 30 mg into the muscle every 30 (thirty) days.    Marland Kitchen levocetirizine (XYZAL) 5 MG tablet Take 5 mg by mouth every evening.     . medroxyPROGESTERone (PROVERA) 10 MG tablet Take 10 mg by mouth daily.    . Melatonin 10 MG TABS Take 10 mg by mouth daily.    . metoCLOPramide (REGLAN) 10 MG tablet Take 1 tablet (10 mg total) by mouth 3 (three) times daily as needed. For migraine or headache. 15 tablet 12  . montelukast (SINGULAIR) 10 MG tablet Take 10 mg by mouth at bedtime.    . ondansetron (  ZOFRAN ODT) 4 MG disintegrating tablet Take 1-2 tablet (4-8 mg total) by mouth every 8 (eight) hours as needed for nausea or vomiting. 20 tablet 11  . pantoprazole (PROTONIX) 40 MG tablet Take 40 mg by mouth at bedtime.     Marland Kitchen. tiZANidine (ZANAFLEX) 4 MG tablet take 1 tablet by mouth every 6 hours if needed for muscle spasm 30 tablet 11  . topiramate (TOPAMAX) 100 MG tablet Take 1 tablet (100 mg total) by mouth at bedtime. 30 tablet 12  . tretinoin (RETIN-A) 0.1 % cream Apply 1 application topically as needed.  0  . topiramate (TOPAMAX) 25 MG tablet Take 2 tablets (50 mg total) by mouth at bedtime. Take with 100mg  tablet at night for a total of 150mg  at night. 60 tablet 12   No current facility-administered medications for this visit.     Allergies as of 01/14/2016 - Review Complete 01/14/2016  Allergen Reaction Noted  . Imitrex [sumatriptan] Anaphylaxis and Swelling 12/27/2013  . Peanuts [peanut oil] Anaphylaxis 12/27/2013  . 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 Lemon oil Swelling 04/16/2014  . Other Swelling 09/16/2014  . Strawberry extract Swelling  04/16/2014  . Estrogens Other (See Comments) 09/16/2014    Vitals: BP 120/70 (BP Location: Right Arm, Patient Position: Sitting, Cuff Size: Normal)   Pulse 64   Ht 5\' 4"  (1.626 m)   Wt 210 lb (95.3 kg)   BMI 36.05 kg/m  Last Weight:  Wt Readings from Last 1 Encounters:  01/14/16 210 lb (95.3 kg)   Last Height:   Ht Readings from Last 1 Encounters:  01/14/16 5\' 4"  (1.626 m)    Cranial Nerves:  The pupils are equal, round, and reactive to light. The fundi are normal and flat. Visual fields are full to finger confrontation. Extraocular movements are intact. Trigeminal sensation is intact and the muscles of mastication are normal. The face is symmetric. The palate elevates in the midline. Hearing intact. Voice is normal. Shoulder shrug is normal. The tongue has normal motion without fasciculations.   Motor Observation:  No asymmetry, no atrophy, and no involuntary movements noted. Tone:  Normal muscle tone.   Posture:  Posture is normal. normal erect   Strength:  Strength is V/V in the upper and lower limbs.     Assessment/Plan: Theresa Hicks is a 20 y.o. female here as a referral from Dr. Gavin PottersGrandis for elevated intracranial hypertension s/p VPshunt. Headaches imrpoved, was approx montlhy or less. Recently increased in the setting of stress and decreased sleep. Discussed good sleep hygiene. Allergic rxn to imitrex, Cambia didn't work. Needs visual field testing as part of workup for IIH which can cause irreversible vision loss.    - Topiramate: As far as your medications are concerned, I would like to suggest: Increase nightly Topiramate to 125mg  and then 150mg  at night as needed.    - Reglan prn. Did not work. Cambia did not work prn. Had breathing and throat swelling with imitex (??) and never tried it again. Can try fioricet (very limited) and zofran at onset. Discussed side effects as detaile din AVS.   To prevent or relieve headaches, try the  following:  Cool Compress. Lie down and place a cool compress on your head.   Avoid headache triggers. If certain foods or odors seem to have triggered your migraines in the past, avoid them. A headache diary might help you identify triggers.   Include physical activity in your daily routine. Try a daily walk or other moderate  aerobic exercise.   Manage stress. Find healthy ways to cope with the stressors, such as delegating tasks on your to-do list.   Practice relaxation techniques. Try deep breathing, yoga, massage and visualization.   Eat regularly. Eating regularly scheduled meals and maintaining a healthy diet might help prevent headaches. Also, drink plenty of fluids.   Follow a regular sleep schedule. Sleep deprivation might contribute to headaches  Consider biofeedback. With this mind-body technique, you learn to control certain bodily functions - such as muscle tension, heart rate and blood pressure - to prevent headaches or reduce headache pain.    Proceed to emergency room if you experience new or worsening symptoms or symptoms do not resolve, if you have new neurologic symptoms or if headache is severe, or for any concerning symptom.    - Addendum review Dr. Randon Goldsmithigby's notes for 18 2017: Vision was OD 20/20 OS 20/20 with correction intraocular pressure is normal. Slit lamp exam normal. 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 Funduscopic exam was normal, good color and sharp margins. She was diagnosed with dry eye syndrome. Patient was instructed to follow-up in 4 months with Dr. Sharmon RevereLindquist for OCT optic nerve and visual field. This is to check for comparison to previous test which showed normal findings and no damage to the optic nerve. Visual field analysis 02/20/2015 showed normal visual fields both eyes   F/u 6 months  Cc: Dr. Gavin PottersGrandis and Dr. Scarlette Calicoigby   Kaitlyn Skowron, MD  [facility] [address] [place], [place] [id number]  Phone [phone] Fax [phone]  A total of 30 minutes was spent face-to-face with this patient. Over half this time was spent on counseling patient on the IIH and migraine diagnosis and different diagnostic and therapeutic options available.

## 2016-01-14 NOTE — Patient Instructions (Signed)
Remember to drink plenty of fluid, eat healthy meals and do not skip any meals. Try to eat protein with a every meal and eat a healthy snack such as fruit or nuts in between meals. Try to keep a regular sleep-wake schedule and try to exercise daily, particularly in the form of walking, 20-30 minutes a day, if you can.   As far as your medications are concerned, I would like to suggest: Increase nightly Topiramate to 125mg  and then 150mg  at night as needed.   I would like to see you back in 6-12 months, sooner if we need to. Please call us with any interim questions, concerns, problems, updates or refill requests.   Our phone number is [phone]. We also have an after hours call service for urgent matters and there is a physician on-call for urgent questions. For any emergencies you know to call 911 or go to the nearest emergency room

## 2016-07-21 ENCOUNTER — Ambulatory Visit: Payer: Commercial Managed Care - HMO | Admitting: Neurology

## 2016-08-09 ENCOUNTER — Other Ambulatory Visit: Payer: Self-pay | Admitting: Student

## 2016-08-09 DIAGNOSIS — K2100 Gastro-esophageal reflux disease with esophagitis, without bleeding: Secondary | ICD-10-CM

## 2016-08-09 DIAGNOSIS — R1310 Dysphagia, unspecified: Secondary | ICD-10-CM

## 2016-08-13 ENCOUNTER — Ambulatory Visit
Admission: RE | Admit: 2016-08-13 | Discharge: 2016-08-13 | Disposition: A | Discharge status: Home / Self Care | Payer: 59 | Source: Ambulatory Visit | Attending: Student | Admitting: Student

## 2016-08-13 DIAGNOSIS — K2100 Gastro-esophageal reflux disease with esophagitis, without bleeding: Secondary | ICD-10-CM

## 2016-08-13 DIAGNOSIS — R1310 Dysphagia, unspecified: Secondary | ICD-10-CM | POA: Insufficient documentation

## 2016-08-13 DIAGNOSIS — K319 Disease of stomach and duodenum, unspecified: Secondary | ICD-10-CM | POA: Diagnosis not present

## 2016-08-13 DIAGNOSIS — K219 Gastro-esophageal reflux disease without esophagitis: Secondary | ICD-10-CM | POA: Diagnosis present

## 2016-08-17 ENCOUNTER — Ambulatory Visit (INDEPENDENT_AMBULATORY_CARE_PROVIDER_SITE_OTHER): Payer: 59 | Admitting: Neurology

## 2016-08-17 ENCOUNTER — Encounter (INDEPENDENT_AMBULATORY_CARE_PROVIDER_SITE_OTHER): Payer: Self-pay

## 2016-08-17 ENCOUNTER — Encounter: Payer: Self-pay | Admitting: Neurology

## 2016-08-17 VITALS — BP 126/79 | HR 96 | Ht 63.0 in | Wt 193.4 lb

## 2016-08-17 DIAGNOSIS — G932 Benign intracranial hypertension: Secondary | ICD-10-CM

## 2016-08-17 MED ORDER — TOPIRAMATE 200 MG PO TABS: 200.0000 mg | ORAL_TABLET | Freq: Every day | ORAL | 12 refills | Status: DC

## 2016-08-17 MED ORDER — TIZANIDINE HCL 4 MG PO TABS: ORAL_TABLET | ORAL | 11 refills | Status: DC

## 2016-08-17 NOTE — Progress Notes (Signed)
[facility]    Provider:  Dr Lucia Gaskins Referring Provider: Rolm Gala, MD Primary Care Physician:  Rolm Gala, MD  CC: IIH  Interval history 08/17/2016: Headaches are improved. The only thing that works at onset has been tizanidine. Only 2x a a month, sharp on the back of the right eye, she has light sensitivity, no nausea or vomiting. Continue Tizanidine. She istaking 200mg  of the Topiramate doing well. Vision is stable, still seeing Dr. Hazle Quant and sees him yearly. Discussed teratogenicity.   Interval history 07/15/2015:   On Topiramate. Tried Cambia and reglan for prn. On zanaflex. Has migraines since the age of 54 and also IIH s/p shunting. She is having 1-2 migraines a week. She has a little tingling. The headaches are around the right eyes, light sensitive, sound sensitive. They are lasting a day. Sleep helps. She is allergic to imitrex, throat swelled up. She gets some nausea with the headaches. Zanaflex helps with the migraines. For acute management with try fioricet and zofran. She has some tingling in the hands. No vision changes. She last saw Dr. Hazle Quant in January and has an upcoming appointment. No problems with vision. Headaches are once a week. The headaches start slowly and build up, the zanaflex helps, allergy to imitrex, fioricet did not work, Human resources officer did not work nor did Engineer, mining. Zanaflex helps.   Interval history 04/14/2015: She has been having headaches. Maybe 1-2x a week. They can last a day. Started after christmas. Some times it is the whole head, pressure. Sometimes it is just behind the right eye. Cambia did not helps. No changes in vision. She saw dr. Hazle Quant in January or February. Will request notes from Dr. Hazle Quant. She has light sensitivity. Nausea. Cambia didn't help. Allergy to imitrex. Dad with migraines. She takes a zanflex which helps her sleep. Discussed triggers. She is not getting enough sleep. Not morning headaches.   Interval history  09/16/2014: Since March she has had only a few migraines. She never needed to take the Cambia. The headaches go away on their own. No daily headaches, no pressure headaches, no changes in vision. She had a migraine last night, light sensitivity, an aura. She still has the migraine today, we can try a shot of toradol in the clinic.Gave her a cambia sample and a carrying case with a water cup so she can carry it easier and take it at onset of headache.   Initial visit 04/15/2013: Theresa Hicks is a 21 y.o. female here as a referral from Dr. Gavin Potters for elevated intracranial hypertension s/p VPshunt. She was on diamox 750mg  tid and lasix. Had shunt revision in February and since then hasn't had a headache. Her vision is fine, she needs glasses. 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 Then junior year she started having changing symptoms, she had ringling in the ears, pressure on the right side. Freshman year in college she couldn't read, she was having daily headaches, for 4 years. She was having LPs at [facility] which made her feel better. She would have severe headaches and double vision. She was up to 24 opening pressure. She was on high doses or diamox, nothing else helped but the lumbar punctures. Now her headaches are improved and are occasional, usually only when a storm is coming. She sometimes gets headaches around a storm. She went through a lot with her headaches, botox, DHE, trigger injections, failed multiple medications and nothing would help until the shunt. The headaches  now are approx once a month and difficult to distinguish between migraines and her IIH headaches. These headaches re more the whole head, no nausea, +light sensitivity. Can get to a 5/10 and last 4-5 hours. Allergies to imitrex.   Reviewed notes, labs and imaging from outside physicians, which showed:  Personally reviewed CT images of the head 2016, which showed right frontal VP shunt without  ventriculomegaly  Review of Systems: Patient complains of symptoms per HPI as well as the following symptoms: fatigue, light sensitivity, env allergies, food allergies, headache, frequent waking. Pertinent negatives per HPI. All others negative.  Social History   Social History  . Marital status: Single    Spouse name: N/A  . Number of children: 0  . Years of education: Some colle   Occupational History  . full time student    Social History Main Topics  . Smoking status: Never Smoker  . Smokeless tobacco: Never Used  . Alcohol use No  . Drug use: No  . Sexual activity: No   Other Topics Concern  . Not on file   Social History Narrative   Lives at home with parents and sister.    Caffeine use: Soda: 2 drinks per week       Family History  Problem Relation Age of Onset  . Hypertension Mother   . Arthritis/Rheumatoid Mother   . Hypertension Father   . Migraines Father   . Stroke Paternal Grandfather   . Diabetes Paternal Grandmother   . Diabetes Maternal Grandfather   . Colon cancer Maternal Grandfather   . Breast cancer Maternal Grandmother     Past Medical History:  Diagnosis Date  . Anemia   . Diabetes mellitus without complication (HCC)    prediabetes, but takes Metformin for PCOS  . Environmental allergies   . GERD (gastroesophageal reflux disease)   . Headache    migraines & Intracranial hypertension   . Hypothyroid   . PCOS (polycystic ovarian syndrome)   . PONV (postoperative nausea and vomiting)    little nausea after shunt placement  . 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     Past Surgical History:  Procedure Laterality Date  . COLONOSCOPY     & endoscopy done at the same time as the colonoscopy  . SHUNT REVISION VENTRICULAR-PERITONEAL N/A 03/01/2014   Procedure: SHUNT REVISION VENTRICULAR-PERITONEAL;  Surgeon: Coletta MemosKyle Cabbell, MD;  Location: [facility];  Service: Neurosurgery;  Laterality: N/A;  Shunt  revision  . VENTRICULOPERITONEAL SHUNT Right 01/09/2014   Procedure: SHUNT INSERTION VENTRICULAR-PERITONEAL;  Surgeon: Coletta MemosKyle Cabbell, MD;  Location: [facility];  Service: Neurosurgery;  Laterality: Right;  right   . WISDOM TOOTH EXTRACTION  2015    Current Outpatient Prescriptions  Medication Sig Dispense Refill  . EPIDUO 0.1-2.5 % gel Apply 1 application topically as needed.  0  . levocetirizine (XYZAL) 5 MG tablet Take 5 mg by mouth every evening.     . medroxyPROGESTERone (PROVERA) 10 MG tablet Take 10 mg by mouth daily.    . Melatonin 10 MG TABS Take 10 mg by mouth daily.    . montelukast (SINGULAIR) 10 MG tablet Take 10 mg by mouth at bedtime.    . pantoprazole (PROTONIX) 40 MG tablet Take 40 mg by mouth at bedtime.     Marland Kitchen. tiZANidine (ZANAFLEX) 4 MG tablet take 1 tablet by mouth every 6 hours if needed for muscle pasm or headache 30 tablet 11  .  tretinoin (RETIN-A) 0.1 % cream Apply 1 application topically as needed.  0  . topiramate (TOPAMAX) 200 MG tablet Take 1 tablet (200 mg total) by mouth at bedtime. 30 tablet 12   No current facility-administered medications for this visit.     Allergies as of 08/17/2016 - Review Complete 08/17/2016  Allergen Reaction Noted  . Imitrex [sumatriptan] Anaphylaxis and Swelling 12/27/2013  . Peanuts [peanut oil] Anaphylaxis 12/27/2013  . 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 Lemon oil Swelling 04/16/2014  . Other Swelling 09/16/2014  . Strawberry extract Swelling 04/16/2014  . Estrogens Other (See Comments) 09/16/2014    Vitals: BP 126/79   Pulse 96   Ht 5\' 3"  (1.6 m)   Wt 193 lb 6.4 oz (87.7 kg)   BMI 34.26 kg/m  Last Weight:  Wt Readings from Last 1 Encounters:  08/17/16 193 lb 6.4 oz (87.7 kg)   Last Height:   Ht Readings from Last 1 Encounters:  08/17/16 5\' 3"  (1.6 m)   Physical exam: Exam: Gen: NAD, conversant, well nourised, obese, well groomed                     CV: RRR, no MRG. No Carotid Bruits. No peripheral edema,  warm, nontender Eyes: Conjunctivae clear without exudates or hemorrhage  Neuro: Detailed Neurologic Exam  Speech:    Speech is normal; fluent and spontaneous with normal comprehension.  Cognition:    The patient is oriented to person, place, and time;     recent and remote memory intact;     language fluent;     normal attention, concentration,     fund of knowledge Cranial Nerves:    The pupils are equal, round, and reactive to light. The fundi are normal and spontaneous venous pulsations are present. Visual fields are full to finger confrontation. Extraocular movements are intact. Trigeminal sensation is intact and the muscles of mastication are normal. The face is symmetric. The palate elevates in the midline. Hearing intact. Voice is normal. Shoulder shrug is normal. The tongue has normal motion without fasciculations.   Coordination:    Normal finger to nose and heel to shin. Normal rapid alternating movements.   Gait:    Heel-toe and tandem gait are normal.   Motor Observation:    No asymmetry, no atrophy, and no involuntary movements noted. Tone:    Normal muscle tone.    Posture:    Posture is normal. normal erect    Strength:    Strength is V/V in the upper and lower limbs.      Sensation: intact to LT     Reflex Exam:  DTR's:    Deep tendon reflexes in the upper and lower extremities are normal bilaterally.   Toes:    The toes are downgoing bilaterally.   Clonus:    Clonus is absent.      Assessment/Plan: ADALAI PERL is a 21 y.o. female here as a referral from Dr. Gavin Potters for elevated intracranial hypertension s/p VPshunt. Headaches imrpoved, was approx montlhy or less. Recently increased in the setting of stress and decreased sleep. Discussed good sleep hygiene. Allergic rxn to imitrex, Cambia didn't work. Needs visual field testing as part of workup for IIH which can cause irreversible vision loss.    - IIH: stable. Topiramate: Continue -  Obesity: Stable, needs weight loss. Discussed weight loss, recommended healthy weight and wellness center - follow with ophthalmology    - Reglan prn. Did not work. Cambia did  not work prn. Had breathing and throat swelling with imitex (??) and never tried it again. Can try fioricet (very limited) and zofran at onset did not help. Tizanidine helps.   Theresa Dean, MD  [facility] [address] [place], [place] [address]  Phone [phone] Fax [phone]  A total of 15 minutes was spent face-to-face with this patient. Over half this time was spent on counseling patient on the IIH diagnosis and different diagnostic and therapeutic options available.

## 2017-02-21 DIAGNOSIS — F411 Generalized anxiety disorder: Secondary | ICD-10-CM | POA: Insufficient documentation

## 2017-02-21 DIAGNOSIS — L700 Acne vulgaris: Secondary | ICD-10-CM | POA: Insufficient documentation

## 2017-02-21 DIAGNOSIS — F5104 Psychophysiologic insomnia: Secondary | ICD-10-CM | POA: Insufficient documentation

## 2017-06-21 ENCOUNTER — Encounter: Payer: Self-pay | Admitting: Neurology

## 2017-06-28 ENCOUNTER — Other Ambulatory Visit: Payer: Self-pay | Admitting: Neurology

## 2017-06-28 MED ORDER — TOPIRAMATE 200 MG PO TABS: 200.0000 mg | ORAL_TABLET | Freq: Every day | ORAL | 12 refills | Status: DC

## 2017-07-07 ENCOUNTER — Encounter: Payer: Self-pay | Admitting: Neurology

## 2017-07-13 ENCOUNTER — Other Ambulatory Visit: Payer: Self-pay | Admitting: Neurosurgery

## 2017-07-13 DIAGNOSIS — G932 Benign intracranial hypertension: Secondary | ICD-10-CM

## 2017-08-01 ENCOUNTER — Ambulatory Visit
Admission: RE | Admit: 2017-08-01 | Discharge: 2017-08-01 | Disposition: A | Discharge status: Home / Self Care | Payer: 59 | Source: Ambulatory Visit | Attending: Neurosurgery | Admitting: Neurosurgery

## 2017-08-01 DIAGNOSIS — G932 Benign intracranial hypertension: Secondary | ICD-10-CM

## 2017-08-18 ENCOUNTER — Ambulatory Visit: Payer: 59 | Admitting: Neurology

## 2017-08-18 ENCOUNTER — Encounter: Payer: Self-pay | Admitting: Neurology

## 2017-08-18 VITALS — BP 116/60 | HR 79 | Ht 63.0 in | Wt 197.8 lb

## 2017-08-18 DIAGNOSIS — G932 Benign intracranial hypertension: Secondary | ICD-10-CM | POA: Diagnosis not present

## 2017-08-18 NOTE — Patient Instructions (Signed)
Idiopathic Intracranial Hypertension Idiopathic intracranial hypertension (IIH) is a condition that increases pressure around the brain. The fluid that surrounds the brain and spinal cord (cerebrospinal fluid, CSF) increases and causes the pressure. Idiopathic means that the cause of this condition is not known. IIH affects the brain and spinal cord (is a neurological disorder). If this condition is not treated, it can cause vision loss or blindness. What increases the risk? You are more likely to develop this condition if:  You are severely overweight (obese).  You are a woman who has not gone through menopause.  You take certain medicines, such as birth control or steroids.  What are the signs or symptoms? Symptoms of IIH include:  Headaches. This is the most common symptom.  Pain in the shoulders or neck.  Nausea and vomiting.  A "rushing water" or pulsing sound within the ears (pulsatile tinnitus).  Double vision.  Blurred vision.  Brief episodes of complete vision loss.  How is this diagnosed? This condition may be diagnosed based on:  Your symptoms.  Your medical history.  CT scan of the brain.  MRI of the brain.  Magnetic resonance venogram (MRV) to check veins in the brain.  Diagnostic lumbar puncture. This is a procedure to remove and examine a sample of cerebrospinal fluid. This procedure can determine whether too much fluid may be causing IIH.  A thorough eye exam to check for swelling or nerve damage in the eyes.  How is this treated? Treatment for this condition depends on your symptoms. The goal of treatment is to decrease the pressure around your brain. Common treatments include:  Medicines to decrease the production of spinal fluid and lower the pressure within your skull.  Medicines to prevent or treat headaches.  Surgery to place drains (shunts) in your brain to remove excess fluid.  Lumbar puncture to remove excess cerebrospinal  fluid.  Follow these instructions at home:  If you are overweight or obese, work with your health care provider to lose weight.  Take over-the-counter and prescription medicines only as told by your health care provider.  Do not drive or use heavy machinery while taking medicines that can make you sleepy.  Keep all follow-up visits as told by your health care provider. This is important. Contact a health care provider if:  You have changes in your vision, such as: ? Double vision. ? Not being able to see colors (color vision). Get help right away if:  You have any of the following symptoms and they get worse or do not get better. ? Headaches. ? Nausea. ? Vomiting. ? Vision changes or difficulty seeing. Summary  Idiopathic intracranial hypertension (IIH) is a condition that increases pressure around the brain. The cause is not known (is idiopathic).  The most common symptom of IIH is headaches.  Treatment may include medicines or surgery to relieve the pressure on your brain. This information is not intended to replace advice given to you by your health care provider. Make sure you discuss any questions you have with your health care provider. Document Released: 03/22/2001 Document Revised: 12/03/2015 Document Reviewed: 12/03/2015 Elsevier Interactive Patient Education  2017 [employer] Inc[employer]  

## 2017-08-18 NOTE — Progress Notes (Signed)
[facility]    Provider:  Dr Lucia Gaskins Referring Provider: Rolm Gala, MD Primary Care Physician:  Rolm Gala, MD  CC: IIH  Interval history 08/18/2017:  Doing well, had shunt reset, no headaches, no vision changes, she has follow up in ophthalmology, she is feeling fine not symptomatic at all. She stopped the Topiramate, she had side effects.  Continue to follow with Dr. Franky Macho.  IMPRESSION: Satisfactory post treatment appearance status post RIGHT frontal ventriculoperitoneal shunt. Normal ventricular size. No overdrainage or extracerebral fluid complication.  Interval history 08/17/2016: Headaches are improved. The only thing that works at onset has been tizanidine. Only 2x a a month, sharp on the back of the right eye, she has light sensitivity, no nausea or vomiting. Continue Tizanidine. She istaking 200mg  of the Topiramate doing well. Vision is stable, still seeing Dr. Hazle Quant and sees him yearly. Discussed teratogenicity.   Interval history 07/15/2015:   On Topiramate. Tried Cambia and reglan for prn. On zanaflex. Has migraines since the age of 36 and also IIH s/p shunting. She is having 1-2 migraines a week. She has a little tingling. The headaches are around the right eyes, light sensitive, sound sensitive. They are lasting a day. Sleep helps. She is allergic to imitrex, throat swelled up. She gets some nausea with the headaches. Zanaflex helps with the migraines. For acute management with try fioricet and zofran. She has some tingling in the hands. No vision changes. She last saw Dr. Hazle Quant in January and has an upcoming appointment. No problems with vision. Headaches are once a week. The headaches start slowly and build up, the zanaflex helps, allergy to imitrex, fioricet did not work, Human resources officer did not work nor did Engineer, mining. Zanaflex helps.   Interval history 04/14/2015: She has been having headaches. Maybe 1-2x a week. They can last a day. Started after  christmas. Some times it is the whole head, pressure. Sometimes it is just behind the right eye. Cambia did not helps. No changes in vision. She saw dr. Hazle Quant in January or February. Will request notes from Dr. Hazle Quant. She has light sensitivity. Nausea. Cambia didn't help. Allergy to imitrex. Dad with migraines. She takes a zanflex which helps her sleep. Discussed triggers. She is not getting enough sleep. Not morning headaches.   Interval history 09/16/2014: Since March she has had only a few migraines. She never needed to take the Cambia. The headaches go away on their own. No daily headaches, no pressure headaches, no changes in vision. She had a migraine last night, light sensitivity, an aura. She still has the migraine today, we can try a shot of toradol in the clinic.Gave her a cambia sample and a carrying case with a water cup so she can carry it easier and take it at onset of headache.   Initial visit 04/15/2013: VERLAINE EMBRY is a 22 y.o. female here as a referral from Dr. Gavin Potters for elevated intracranial hypertension s/p VPshunt. She was on diamox 750mg  tid and lasix. Had shunt revision in February and since then hasn't had a headache. Her vision is fine, she needs glasses. 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 Then junior year she started having changing symptoms, she had ringling in the ears, pressure on the right side. Freshman year in college she couldn't read, she was having daily headaches, for 4 years. She was having LPs at [facility] which made her feel better. She would have severe headaches and double vision. She was  up to 24 opening pressure. She was on high doses or diamox, nothing else helped but the lumbar punctures. Now her headaches are improved and are occasional, usually only when a storm is coming. She sometimes gets headaches around a storm. She went through a lot with her headaches, botox, DHE, trigger injections, failed multiple medications and nothing  would help until the shunt. 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 These headaches re more the whole head, no nausea, +light sensitivity. Can get to a 5/10 and last 4-5 hours. Allergies to imitrex.   Reviewed notes, labs and imaging from outside physicians, which showed:  Personally reviewed CT images of the head 2016, which showed right frontal VP shunt without ventriculomegaly  Review of Systems: Patient complains of symptoms per HPI as well as the following symptoms: fatigue, light sensitivity, env allergies, food allergies, headache, frequent waking. Pertinent negatives per HPI. All others negative.  Social History   Socioeconomic History  . Marital status: Single    Spouse name: Not on file  . Number of children: 0  . Years of education: Some colle  . Highest education level: Not on file  Occupational History  . Occupation: full time student  Social Needs  . Financial resource strain: Not on file  . Food insecurity:    Worry: Not on file    Inability: Not on file  . Transportation needs:    Medical: Not on file    Non-medical: Not on file  Tobacco Use  . Smoking status: Never Smoker  . Smokeless tobacco: Never Used  Substance and Sexual Activity  . Alcohol use: No  . Drug use: No  . Sexual activity: Never  Lifestyle  . Physical activity:    Days per week: Not on file    Minutes per session: Not on file  . Stress: Not on file  Relationships  . Social connections:    Talks on phone: Not on file    Gets together: Not on file    Attends religious service: Not on file    Active member of club or organization: Not on file    Attends meetings of clubs or organizations: Not on file    Relationship status: Not on file  . Intimate partner violence:    Fear of current or ex partner: Not on file    Emotionally abused: Not on file    Physically abused: Not on file    Forced sexual activity: Not on  file  Other Topics Concern  . Not on file  Social History Narrative   Lives at home with parents and sister.    Caffeine use: Soda: 2 drinks per week    Family History  Problem Relation Age of Onset  . Hypertension Mother   . Arthritis/Rheumatoid Mother   . Hypertension Father   . Migraines Father   . Stroke Paternal Grandfather   . Diabetes Paternal Grandmother   . Diabetes Maternal Grandfather   . Colon cancer Maternal Grandfather   . Breast cancer Maternal Grandmother     Past Medical History:  Diagnosis Date  . Anemia   . Diabetes mellitus without complication (HCC)    prediabetes, but takes Metformin for PCOS  . Environmental allergies   . GERD (gastroesophageal reflux disease)   . Headache    migraines & Intracranial hypertension   . Hypothyroid   . PCOS (polycystic ovarian syndrome)   . PONV (postoperative nausea and  vomiting)    little nausea after shunt placement  . 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     Past Surgical History:  Procedure Laterality Date  . COLONOSCOPY     & endoscopy done at the same time as the colonoscopy  . SHUNT REVISION VENTRICULAR-PERITONEAL N/A 03/01/2014   Procedure: SHUNT REVISION VENTRICULAR-PERITONEAL;  Surgeon: Coletta Memos, MD;  Location: [facility];  Service: Neurosurgery;  Laterality: N/A;  Shunt revision  . VENTRICULOPERITONEAL SHUNT Right 01/09/2014   Procedure: SHUNT INSERTION VENTRICULAR-PERITONEAL;  Surgeon: Coletta Memos, MD;  Location: [facility];  Service: Neurosurgery;  Laterality: Right;  right   . WISDOM TOOTH EXTRACTION  2015    Current Outpatient Medications  Medication Sig Dispense Refill  . EPIDUO 0.1-2.5 % gel Apply 1 application topically as needed.  0  . levocetirizine (XYZAL) 5 MG tablet Take 5 mg by mouth every evening.     . medroxyPROGESTERone (PROVERA) 10 MG tablet Take 10 mg by mouth daily.    . Melatonin 10 MG TABS Take 10 mg by mouth daily.    .  montelukast (SINGULAIR) 10 MG tablet Take 10 mg by mouth at bedtime.    . pantoprazole (PROTONIX) 40 MG tablet Take 40 mg by mouth at bedtime.     Marland Kitchen tiZANidine (ZANAFLEX) 4 MG tablet take 1 tablet by mouth every 6 hours if needed for muscle pasm or headache 30 tablet 11  . tretinoin (RETIN-A) 0.1 % cream Apply 1 application topically as needed.  0  . topiramate (TOPAMAX) 200 MG tablet Take 1 tablet (200 mg total) by mouth at bedtime. (Patient not taking: Reported on 08/18/2017) 30 tablet 12   No current facility-administered medications for this visit.     Allergies as of 08/18/2017 - Review Complete 08/18/2017  Allergen Reaction Noted  . Imitrex [sumatriptan] Anaphylaxis and Swelling 12/27/2013  . Peanuts [peanut oil] Anaphylaxis 12/27/2013  . 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 Lemon oil Swelling 04/16/2014  . Other Swelling 09/16/2014  . Strawberry extract Swelling 04/16/2014  . Estrogens Other (See Comments) 09/16/2014    Vitals: BP 116/60   Pulse 79   Ht 5\' 3"  (1.6 m)   Wt 197 lb 12.8 oz (89.7 kg)   SpO2 99%   BMI 35.04 kg/m  Last Weight:  Wt Readings from Last 1 Encounters:  08/18/17 197 lb 12.8 oz (89.7 kg)   Last Height:   Ht Readings from Last 1 Encounters:  08/18/17 5\' 3"  (1.6 m)   Physical exam: Exam: Gen: NAD, conversant, well nourised, obese, well groomed                     CV: RRR, no MRG. No Carotid Bruits. No peripheral edema, warm, nontender Eyes: Conjunctivae clear without exudates or hemorrhage  Neuro: Detailed Neurologic Exam  Speech:    Speech is normal; fluent and spontaneous with normal comprehension.  Cognition:    The patient is oriented to person, place, and time;     recent and remote memory intact;     language fluent;     normal attention, concentration,     fund of knowledge Cranial Nerves:    The pupils are equal, round, and reactive to light. The fundi are normal and spontaneous venous pulsations are present. Visual fields are  full to finger confrontation. Extraocular movements are intact. Trigeminal sensation is intact and the muscles of mastication are normal. The face is  symmetric. The palate elevates in the midline. Hearing intact. Voice is normal. Shoulder shrug is normal. The tongue has normal motion without fasciculations.   Coordination:    Normal finger to nose and heel to shin. Normal rapid alternating movements.   Gait:    Heel-toe and tandem gait are normal.   Motor Observation:    No asymmetry, no atrophy, and no involuntary movements noted. Tone:    Normal muscle tone.    Posture:    Posture is normal. normal erect    Strength:    Strength is V/V in the upper and lower limbs.      Sensation: intact to LT     Reflex Exam:  DTR's:    Deep tendon reflexes in the upper and lower extremities are normal bilaterally.   Toes:    The toes are downgoing bilaterally.   Clonus:    Clonus is absent.       Assessment/Plan: CHAU SAVELL is a 22 y.o. female here as a referral from Dr. Gavin Potters for elevated intracranial hypertension s/p VPshunt. Asymptomatic doing well.  - IIH: stable.  - Obesity: Stable, needs weight loss. Discussed weight loss, recommended healthy weight and wellness center - follow with ophthalmology - Follow with Dr. Franky Macho for vp shunt - fundoscopic exam normal - reviewed images of recent CT head with patient, normal vent size  Prior acute management - Reglan prn. Did not work. Cambia did not work prn. Had breathing and throat swelling with imitex (??) and never tried it again. Can try fioricet (very limited) and zofran at onset did not help. Tizanidine helps.   Naomie Dean, MD  [facility] [address] [place], [place] [address]  Phone [phone] Fax [phone]  A total of 15 minutes was spent face-to-face with this patient. Over half this time was spent on counseling patient on the IIH diagnosis and different  diagnostic and therapeutic options available.

## 2018-01-10 ENCOUNTER — Other Ambulatory Visit: Payer: Self-pay | Admitting: Neurology

## 2018-01-10 MED ORDER — PREDNISONE 20 MG PO TABS: 60.0000 mg | ORAL_TABLET | Freq: Every day | ORAL | 0 refills | Status: DC

## 2018-01-11 ENCOUNTER — Other Ambulatory Visit: Payer: Self-pay | Admitting: Neurology

## 2018-01-11 MED ORDER — NARATRIPTAN HCL 2.5 MG PO TABS: ORAL_TABLET | ORAL | 0 refills | Status: DC

## 2018-02-06 ENCOUNTER — Telehealth: Payer: Self-pay | Admitting: Neurology

## 2018-02-06 NOTE — Telephone Encounter (Signed)
Appointment Request From: Theresa Hicks    With Provider: Anson Fret, MD [[facility]]    Preferred Date Range: Any    Preferred Times: Any time    Reason for visit: Request an Appointment    Comments:  I am still having the extreme head pain I messaged Dr. Lucia Gaskins about a couple of weeks ago. I completed the steroid and triptan medication but have started having the intense headaches more frequently and lasting longer.

## 2018-02-06 NOTE — Telephone Encounter (Signed)
I spoke with the patient and offered an appt tomorrow or Wed. Patient scheduled for this Wed 1/15 @ 1:00 arrival 12:30-45. Pt verbalized appreciation.

## 2018-02-08 ENCOUNTER — Ambulatory Visit: Payer: 59 | Admitting: Neurology

## 2018-02-08 ENCOUNTER — Encounter: Payer: Self-pay | Admitting: Neurology

## 2018-02-08 VITALS — BP 139/80 | HR 94 | Ht 63.0 in | Wt 211.0 lb

## 2018-02-08 DIAGNOSIS — G44019 Episodic cluster headache, not intractable: Secondary | ICD-10-CM | POA: Diagnosis not present

## 2018-02-08 DIAGNOSIS — G932 Benign intracranial hypertension: Secondary | ICD-10-CM

## 2018-02-08 MED ORDER — GALCANEZUMAB-GNLM 100 MG/ML ~~LOC~~ SOSY: 300.0000 mg | PREFILLED_SYRINGE | SUBCUTANEOUS | 11 refills | Status: DC

## 2018-02-08 MED ORDER — ZOLMITRIPTAN 5 MG NA SOLN: 1.0000 | NASAL | 0 refills | Status: DC | PRN

## 2018-02-08 NOTE — Patient Instructions (Signed)
Emgality 300mg  monthly (see literature provided) At onset of cluster try Zomig nasal spray   Cluster Headache A cluster headache is a type of headache that causes deep, intense head pain. Cluster headaches can last from 15 minutes to 3 hours. They usually occur:  On one side of the head. They may occur on the other side when a new cluster of headaches begins.  Repeatedly over weeks to months.  Several times a day.  At the same time of day, often at night.  More often in the fall and springtime. What are the causes? The cause of this condition is not known. What increases the risk? This condition is more likely to develop in:  Males.  People who drink alcohol.  People who smoke or use products that contain nicotine or tobacco.  People who take medicines that cause blood vessels to expand, such as nitroglycerin.  People who take antihistamines. What are the signs or symptoms? Symptoms of this condition include:  Severe pain on one side of the head that begins behind or around your eye or temple.  Pain on one side of the head.  Nausea.  Sensitivity to light.  Runny nose and nasal stuffiness.  Sweaty, pale skin on the face.  Droopy or swollen eyelid, eye redness, or tearing.  Restlessness and agitation. How is this diagnosed? This condition may be diagnosed based on:  Your symptoms.  A physical exam. Your health care provider may order tests to see if your headaches are caused by another medical condition. These tests may show that you do not have cluster headaches. Tests may include:  A CT scan of your head.  An MRI of your head.  Lab tests. How is this treated? This condition may be treated with:  Medicines to relieve pain and to prevent repeated (recurrent) attacks. Some people may need a combination of medicines.  Oxygen. This helps to relieve pain. Follow these instructions at home: Headache diary Keep a headache diary as told by your health care  provider. Doing this can help you and your health care provider figure out what triggers your headaches. In your headache diary, include information about:  The time of day that your headache started and what you were doing when it began.  How long your headache lasted.  Where your pain started and whether it moved to other areas.  The type of pain, such as burning, stabbing, throbbing, or cramping.  Your level of pain. Use a pain scale and rate the pain with a number from 1 (mild) up to 10 (severe).  The treatment that you used, and any change in symptoms after treatment.  Medicines  Take over-the-counter and prescription medicines only as told by your health care provider.  Do not drive or use heavy machinery while taking prescription pain medicine.  Use oxygen as told by your health care provider. Lifestyle  Follow a regular sleep schedule. Do not vary the time that you go to bed or the amount that you sleep from day to day. It is important to stay on the same schedule during a cluster period to help prevent headaches.  Exercise regularly.  Eat a healthy diet and avoid foods that may trigger your headaches.  Avoid alcohol.  Do not use any products that contain nicotine or tobacco, such as cigarettes and e-cigarettes. If you need help quitting, ask your health care provider. Contact a health care provider if:  Your headaches change, become more severe, or occur more often.  The  medicine or oxygen that your health care provider recommended does not help. Get help right away if:  You faint.  You have weakness or numbness, especially on one side of your body or face.  You have double vision.  You have nausea or vomiting that does not go away within several hours.  You have trouble talking, walking, or keeping your balance.  You have pain or stiffness in your neck.  You have a fever. Summary  A cluster headache is a type of headache that causes deep, intense head  pain, usually on one side of the head.  Keep a headache diary to help discover what triggers your headaches.  A regular sleep schedule can help prevent headaches. This information is not intended to replace advice given to you by your health care provider. Make sure you discuss any questions you have with your health care provider. Document Released: 01/11/2005 Document Revised: 09/23/2015 Document Reviewed: 09/23/2015 Elsevier Interactive Patient Education  2019 [employer]

## 2018-02-08 NOTE — Progress Notes (Addendum)
[facility]    Provider:  Dr Lucia GaskinsAhern Referring Provider: Rolm GalaGrandis, [name], MD Primary Care Physician:  Rolm GalaGrandis, [name], MD  CC: IIH, cluster headache  Interval history 02/08/2018: Here for follow up of IIH s/p shunt, migraines. Tried imitrex acutely and had side effects. Gave naratriptan as longer acting to see if they would help before bed for her recent cluster headaches. Also gave her a prednisone taper at the start of her cluster headaches.  In early December having a new headache that wakes her every night at the same time. It wakes her, it is excruciating, she is agitated and walking around, always on the right, she gets congested on the right. Lasts 30 minutes one night it lasted an hour. She had it once when napping during the day, always around the same time at night.  Interval history 08/18/2017:  Doing well, had shunt reset, no headaches, no vision changes, she has follow up in ophthalmology, she is feeling fine not symptomatic at all. She stopped the Topiramate, she had side effects.  Continue to follow with Dr. Franky Machoabbell.  IMPRESSION: Satisfactory post treatment appearance status post RIGHT frontal ventriculoperitoneal shunt. Normal ventricular size. No overdrainage or extracerebral fluid complication.  Interval history 08/17/2016: Headaches are improved. The only thing that works at onset has been tizanidine. Only 2x a a month, sharp on the back of the right eye, she has light sensitivity, no nausea or vomiting. Continue Tizanidine. She istaking 200mg  of the Topiramate doing well. Vision is stable, still seeing Dr. Hazle Quantigby and sees him yearly. Discussed teratogenicity.   Interval history 07/15/2015:   On Topiramate. Tried Cambia and reglan for prn. On zanaflex. Has migraines since the age of 23 and also IIH s/p shunting. She is having 1-2 migraines a week. She has a little tingling. The headaches are around the right eyes, light sensitive, sound sensitive. They are  lasting a day. Sleep helps. She is allergic to imitrex, throat swelled up. She gets some nausea with the headaches. Zanaflex helps with the migraines. For acute management with try fioricet and zofran. She has some tingling in the hands. No vision changes. She last saw Dr. Hazle Quantigby in January and has an upcoming appointment. No problems with vision. Headaches are once a week. The headaches start slowly and build up, the zanaflex helps, allergy to imitrex, fioricet did not work, Human resources officercambia did not work nor did Engineer, miningthe reglan. Zanaflex helps.   Interval history 04/14/2015: She has been having headaches. Maybe 1-2x a week. They can last a day. Started after christmas. Some times it is the whole head, pressure. Sometimes it is just behind the right eye. Cambia did not helps. No changes in vision. She saw dr. Hazle Quantigby in January or February. Will request notes from Dr. Hazle Quantigby. She has light sensitivity. Nausea. Cambia didn't help. Allergy to imitrex. Dad with migraines. She takes a zanflex which helps her sleep. Discussed triggers. She is not getting enough sleep. Not morning headaches.   Interval history 09/16/2014: Since March she has had only a few migraines. She never needed to take the Cambia. The headaches go away on their own. No daily headaches, no pressure headaches, no changes in vision. She had a migraine last night, light sensitivity, an aura. She still has the migraine today, we can try a shot of toradol in the clinic.Gave her a cambia sample and a carrying case with a water cup so she can carry it easier and take it at onset of headache.   Initial visit  04/15/2013: GLENISE FEAST is a 22 y.o. female here as a referral from Dr. Gavin Potters for elevated intracranial hypertension s/p VPshunt. She was on diamox 750mg  tid and lasix. Had shunt revision in February and since then hasn't had a headache. Her vision is fine, she needs glasses. 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 Then junior year she  started having changing symptoms, she had ringling in the ears, pressure on the right side. Freshman year in college she couldn't read, she was having daily headaches, for 4 years. She was having LPs at [facility] which made her feel better. She would have severe headaches and double vision. She was up to 24 opening pressure. She was on high doses or diamox, nothing else helped but the lumbar punctures. Now her headaches are improved and are occasional, usually only when a storm is coming. She sometimes gets headaches around a storm. She went through a lot with her headaches, botox, DHE, trigger injections, failed multiple medications and nothing would help until the shunt. 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 These headaches re more the whole head, no nausea, +light sensitivity. Can get to a 5/10 and last 4-5 hours. Allergies to imitrex.   Reviewed notes, labs and imaging from outside physicians, which showed:  Personally reviewed CT images of the head 2016, which showed right frontal VP shunt without ventriculomegaly  Review of Systems: Patient complains of symptoms per HPI as well as the following symptoms: fatigue, light sensitivity, env allergies, food allergies, headache, frequent waking. Pertinent negatives per HPI. All others negative.  Social History   Socioeconomic History  . Marital status: Single    Spouse name: Not on file  . Number of children: 0  . Years of education: Some colle  . Highest education level: Not on file  Occupational History  . Occupation: full time student  Social Needs  . Financial resource strain: Not on file  . Food insecurity:    Worry: Not on file    Inability: Not on file  . Transportation needs:    Medical: Not on file    Non-medical: Not on file  Tobacco Use  . Smoking status: Never Smoker  . Smokeless tobacco: Never Used  Substance and Sexual Activity  . Alcohol use:  No  . Drug use: No  . Sexual activity: Never  Lifestyle  . Physical activity:    Days per week: Not on file    Minutes per session: Not on file  . Stress: Not on file  Relationships  . Social connections:    Talks on phone: Not on file    Gets together: Not on file    Attends religious service: Not on file    Active member of club or organization: Not on file    Attends meetings of clubs or organizations: Not on file    Relationship status: Not on file  . Intimate partner violence:    Fear of current or ex partner: Not on file    Emotionally abused: Not on file    Physically abused: Not on file    Forced sexual activity: Not on file  Other Topics Concern  . Not on file  Social History Narrative   Lives at home with parents and sister.    Caffeine use: Soda: 2 drinks per week    Family History  Problem Relation Age of Onset  . Hypertension Mother   .  Arthritis/Rheumatoid Mother   . Hypertension Father   . Migraines Father   . Stroke Paternal Grandfather   . Diabetes Paternal Grandmother   . Diabetes Maternal Grandfather   . Colon cancer Maternal Grandfather   . Breast cancer Maternal Grandmother     Past Medical History:  Diagnosis Date  . Anemia   . Diabetes mellitus without complication (HCC)    prediabetes, but takes Metformin for PCOS  . Environmental allergies   . GERD (gastroesophageal reflux disease)   . Headache    migraines & Intracranial hypertension   . Hypothyroid   . PCOS (polycystic ovarian syndrome)   . PONV (postoperative nausea and vomiting)    little nausea after shunt placement  . 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     Past Surgical History:  Procedure Laterality Date  . COLONOSCOPY     & endoscopy done at the same time as the colonoscopy  . SHUNT REVISION VENTRICULAR-PERITONEAL N/A 03/01/2014   Procedure: SHUNT REVISION VENTRICULAR-PERITONEAL;  Surgeon: Coletta Memos, MD;  Location: [facility];   Service: Neurosurgery;  Laterality: N/A;  Shunt revision  . VENTRICULOPERITONEAL SHUNT Right 01/09/2014   Procedure: SHUNT INSERTION VENTRICULAR-PERITONEAL;  Surgeon: Coletta Memos, MD;  Location: [facility];  Service: Neurosurgery;  Laterality: Right;  right   . WISDOM TOOTH EXTRACTION  2015    Current Outpatient Medications  Medication Sig Dispense Refill  . EPIDUO 0.1-2.5 % gel Apply 1 application topically as needed.  0  . levocetirizine (XYZAL) 5 MG tablet Take 5 mg by mouth every evening.     . Melatonin 10 MG TABS Take 10 mg by mouth daily.    . naratriptan (AMERGE) 2.5 MG tablet Take one pill in the morning an dif no side effects take one tablet at bedtime for 5 days. 5 tablet 0  . pantoprazole (PROTONIX) 40 MG tablet Take 40 mg by mouth at bedtime.     Marland Kitchen tiZANidine (ZANAFLEX) 4 MG tablet TAKE 1 TABLET BY MOUTH EVERY 6 HOURS IF NEEDED FOR MUSCLE SPASM OR HEADACHE 30 tablet 5  . Galcanezumab-gnlm (EMGALITY, 300 MG DOSE,) 100 MG/ML SOSY Inject 300 mg into the skin every 30 (thirty) days. 3 mL 11  . zolmitriptan (ZOMIG) 5 MG nasal solution Place 1 spray into the nose as needed for migraine. 1 Units 0   No current facility-administered medications for this visit.     Allergies as of 02/08/2018 - Review Complete 02/08/2018  Allergen Reaction Noted  . Imitrex [sumatriptan] Anaphylaxis and Swelling 12/27/2013  . Peanuts [peanut oil] Anaphylaxis 12/27/2013  . 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 Lemon oil Swelling 04/16/2014  . Other Swelling 09/16/2014  . Strawberry extract Swelling 04/16/2014  . Estrogens Other (See Comments) 09/16/2014    Vitals: BP 139/80 (BP Location: Right Arm, Patient Position: Sitting)   Pulse 94   Ht 5\' 3"  (1.6 m)   Wt 211 lb (95.7 kg)   LMP 01/18/2018 (Exact Date)   BMI 37.38 kg/m  Last Weight:  Wt Readings from Last 1 Encounters:  02/08/18 211 lb (95.7 kg)   Last Height:   Ht Readings from Last 1 Encounters:  02/08/18 5\' 3"  (1.6 m)    Physical exam: Exam: Gen: NAD, conversant, well nourised, obese, well groomed                     CV: RRR, no MRG. No Carotid Bruits. No peripheral edema,  warm, nontender Eyes: Conjunctivae clear without exudates or hemorrhage  Neuro: Detailed Neurologic Exam  Speech:    Speech is normal; fluent and spontaneous with normal comprehension.  Cognition:    The patient is oriented to person, place, and time;     recent and remote memory intact;     language fluent;     normal attention, concentration,     fund of knowledge Cranial Nerves:    The pupils are equal, round, and reactive to light. The fundi are normal and spontaneous venous pulsations are present. Visual fields are full to finger confrontation. Extraocular movements are intact. Trigeminal sensation is intact and the muscles of mastication are normal. The face is symmetric. The palate elevates in the midline. Hearing intact. Voice is normal. Shoulder shrug is normal. The tongue has normal motion without fasciculations.   Coordination:    Normal finger to nose and heel to shin. Normal rapid alternating movements.   Gait:    Heel-toe and tandem gait are normal.   Motor Observation:    No asymmetry, no atrophy, and no involuntary movements noted. Tone:    Normal muscle tone.    Posture:    Posture is normal. normal erect    Strength:    Strength is V/V in the upper and lower limbs.      Sensation: intact to LT     Reflex Exam:  DTR's:    Deep tendon reflexes in the upper and lower extremities are normal bilaterally.   Toes:    The toes are downgoing bilaterally.   Clonus:    Clonus is absent.       Assessment/Plan: NANAKO STOPHER is a 23 y.o. female here as a referral from Dr. Gavin Potters for elevated intracranial hypertension s/p VPshunt. Asymptomatic doing well.  - IIH: stable.  - new cluster headache syndrome will start emgality and try zomig nasal spray - Obesity: Stable, needs weight loss.  Discussed weight loss, recommended healthy weight and wellness center - follow with ophthalmology - Follow with Dr. Franky Macho for vp shunt - fundoscopic exam normal - reviewed images of recent CT head with patient, normal vent size  Prior acute management - Reglan prn. Did not work. Cambia did not work prn. Had breathing and throat swelling with imitex (??) and never tried it again. Can try fioricet (very limited) and zofran at onset did not help. Tizanidine helps.  - recommend MRI brain with thin slices through pituitary and cavernouns sinus, she declines at this time and will consider if no improvement  A total of 25 minutes was spent face-to-face with this patient. Over half this time was spent on counseling patient on the  1. Episodic cluster headache, not intractable   2. IIH (idiopathic intracranial hypertension)     diagnosis and different diagnostic and therapeutic options, counseling and coordination of care, risks ans benefits of management, compliance, or risk factor reduction and education.      Naomie Dean, MD  [facility] [address] [place], [place] [address]  Phone [phone] Fax [phone]  A total of 15 minutes was spent face-to-face with this patient. Over half this time was spent on counseling patient on the IIH diagnosis and different diagnostic and therapeutic options available.

## 2018-02-13 ENCOUNTER — Telehealth: Payer: Self-pay | Admitting: Neurology

## 2018-02-13 NOTE — Telephone Encounter (Addendum)
Completed Emgality 100 mg PA (300 mg dose) on Cover My Meds. Key: [id number].  "If [employer] has not responded to your request within 24 hours, contact [employer] at [phone]."  Spoke with pt and informed her that PA is pending. She verbalized appreciation.

## 2018-02-13 NOTE — Telephone Encounter (Signed)
Pt mother (on [place]) called stating that her daughter is needing a refill on her Galcanezumab-gnlm (EMGALITY, 300 MG DOSE,) 100 MG/ML SOSY and the pharmacy is needing a PA sent to the [place] on file. Mother would like for [place] to contact pt once this is done at [phone]

## 2018-02-13 NOTE — Telephone Encounter (Signed)
Called [facility] to obtain 2020 insurance info:  [facility] [phone] ID: [id number]  [phone]) PCN: [id number] BIN: [id number] GROUP: [id number]

## 2018-02-14 ENCOUNTER — Encounter: Payer: Self-pay | Admitting: *Deleted

## 2018-02-14 NOTE — Telephone Encounter (Signed)
Received add'l PA questions. Form completed, signed & faxed back to [facility] [employer]. Received a receipt of confirmation.

## 2018-02-14 NOTE — Telephone Encounter (Signed)
Received [facility] [employer] approval of Emgality 100 mg pen. Approval period from 02/14/2018 through 02/15/2019.   [facility]axed [facility] letter to [facility]. Received a receipt of confirmation.

## 2018-03-06 DIAGNOSIS — J302 Other seasonal allergic rhinitis: Secondary | ICD-10-CM | POA: Insufficient documentation

## 2018-03-07 ENCOUNTER — Encounter: Payer: Self-pay | Admitting: Podiatry

## 2018-03-07 ENCOUNTER — Other Ambulatory Visit: Payer: Self-pay | Admitting: Podiatry

## 2018-03-07 ENCOUNTER — Ambulatory Visit: Payer: 59 | Admitting: Podiatry

## 2018-03-07 ENCOUNTER — Ambulatory Visit (INDEPENDENT_AMBULATORY_CARE_PROVIDER_SITE_OTHER): Payer: 59

## 2018-03-07 VITALS — BP 116/73 | HR 114

## 2018-03-07 DIAGNOSIS — M722 Plantar fascial fibromatosis: Secondary | ICD-10-CM | POA: Diagnosis not present

## 2018-03-07 DIAGNOSIS — M2141 Flat foot [pes planus] (acquired), right foot: Secondary | ICD-10-CM

## 2018-03-07 DIAGNOSIS — M779 Enthesopathy, unspecified: Secondary | ICD-10-CM

## 2018-03-07 DIAGNOSIS — M2142 Flat foot [pes planus] (acquired), left foot: Secondary | ICD-10-CM

## 2018-03-07 MED ORDER — IBUPROFEN 800 MG PO TABS
800.0000 mg | ORAL_TABLET | Freq: Three times a day (TID) | ORAL | 0 refills | Status: DC | PRN
Start: 2018-03-07 — End: 2018-10-13

## 2018-03-12 NOTE — Progress Notes (Signed)
   Subjective:  23 year old female presenting today as a new patient with a chief complaint of intermittent pain to the lateral aspects of the feet bilaterally that began several years ago. She states the pain is worse in the morning time when she first gets up. She notes throbbing pain to the feet when she is sitting. She has not done anything for treatment. Patient is here for further evaluation and treatment.  Past Medical History:  Diagnosis Date  . Anemia   . Diabetes mellitus without complication (HCC)    prediabetes, but takes Metformin for PCOS  . Environmental allergies   . GERD (gastroesophageal reflux disease)   . Headache    migraines & Intracranial hypertension   . Hypothyroid   . PCOS (polycystic ovarian syndrome)   . PONV (postoperative nausea and vomiting)    little nausea after shunt placement  . 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        Objective/Physical Exam General: The patient is alert and oriented x3 in no acute distress.  Dermatology: Skin is warm, dry and supple bilateral lower extremities. Negative for open lesions or macerations.  Vascular: Palpable pedal pulses bilaterally. No edema or erythema noted. Capillary refill within normal limits.  Neurological: Epicritic and protective threshold grossly intact bilaterally.   Musculoskeletal Exam: Range of motion within normal limits to all pedal and ankle joints bilateral. Muscle strength 5/5 in all groups bilateral.  Upon weightbearing there is a medial longitudinal arch collapse bilaterally. Remove foot valgus noted to the bilateral lower extremities with excessive pronation upon mid stance. Tenderness to palpation to the plantar aspect of the bilateral heels along the plantar fascia.  Radiographic Exam:  Normal osseous mineralization. Joint spaces preserved. No fracture/dislocation/boney destruction.   Pes planus noted on radiographic exam lateral views. Decreased  calcaneal inclination and metatarsal declination angle is noted. Anterior break in the cyma line noted on lateral views. Medial talar head to deviation noted on AP radiograph.   Assessment: 1. pes planus bilateral 2. pain in bilateral feet - lateral column    Plan of Care:  1. Patient was evaluated. X-Rays reviewed.  2. Appointment with Raiford Noble, Pedorthist, for custom molded orthotics.  3. Prescription for Motrin 800 mg provided to patient.  4. Return to clinic as needed.    Felecia Shelling, DPM [facility]  Dr. Felecia Shelling, DPM    [address]                                        [place], [place] [address]                Office [phone]  Fax [phone]

## 2018-03-15 ENCOUNTER — Ambulatory Visit (INDEPENDENT_AMBULATORY_CARE_PROVIDER_SITE_OTHER): Payer: 59 | Admitting: Orthotics

## 2018-03-15 DIAGNOSIS — M2142 Flat foot [pes planus] (acquired), left foot: Secondary | ICD-10-CM

## 2018-03-15 DIAGNOSIS — M2141 Flat foot [pes planus] (acquired), right foot: Secondary | ICD-10-CM

## 2018-03-15 NOTE — Progress Notes (Signed)
Patient presents with history of PTTD/Pes Planus (acquired).  Patient demonstrated medial shift talus/drop navicular, and medial column collapse.   Plan is for CMFO w/ longitudinal arch support, rear foot stability to address eversion.  Fabrication will be with semi rigid/rigid poly pro shell, deep heel seat, wide, padding under spenco cover. Plan on                                      To fabricate. 

## 2018-04-05 ENCOUNTER — Other Ambulatory Visit: Payer: 59 | Admitting: Orthotics

## 2018-04-13 ENCOUNTER — Other Ambulatory Visit: Payer: Self-pay

## 2018-04-13 ENCOUNTER — Ambulatory Visit: Payer: 59 | Admitting: Orthotics

## 2018-04-13 DIAGNOSIS — M2142 Flat foot [pes planus] (acquired), left foot: Principal | ICD-10-CM

## 2018-04-13 DIAGNOSIS — M722 Plantar fascial fibromatosis: Secondary | ICD-10-CM

## 2018-04-13 DIAGNOSIS — M2141 Flat foot [pes planus] (acquired), right foot: Secondary | ICD-10-CM

## 2018-04-13 NOTE — Progress Notes (Signed)
Patient came in today to pick up custom made foot orthotics.  The goals were accomplished and the patient reported no dissatisfaction with said orthotics.  Patient was advised of breakin period and how to report any issues. 

## 2018-10-11 NOTE — Progress Notes (Signed)
PCP:  Rolm GalaGrandis, [name], MD   Chief Complaint  Patient presents with  . Gynecologic Exam  . Immunizations    flu shot today     HPI:      Ms. Theresa Hicks is a 23 y.o. No obstetric history on file. who LMP was Patient's last menstrual period was 10/02/2018 (exact date)., presents today for her NP annual examination.  Her menses are regular every 28-30 days, lasting 5 days.  Dysmenorrhea mild, occurring first 1-2 days of flow. She does not have intermenstrual bleeding. Hx of PCOS, migraines and intracranial HTN.  Sex activity: single partner, contraception - condoms, declines other BC.  Last Pap: no hx of abn Hx of STDs: none  There is a FH of breast cancer in her MGM, genetic testing not done. There is no FH of ovarian cancer. The patient does do self-breast exams.  Tobacco use: The patient denies current or previous tobacco use. Alcohol use: none No drug use.  Exercise: moderately active  She does not get adequate calcium and Vitamin D in her diet.  Gardasil completed.   Past Medical History:  Diagnosis Date  . Anemia   . Diabetes mellitus without complication (HCC)    prediabetes, but takes Metformin for PCOS  . Environmental allergies   . GERD (gastroesophageal reflux disease)   . Headache    migraines & Intracranial hypertension   . Hypothyroid   . PCOS (polycystic ovarian syndrome)   . PONV (postoperative nausea and vomiting)    little nausea after shunt placement  . 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   . Vaccine for human papilloma virus (HPV) types 6, 11, 16, and 18 administered     Past Surgical History:  Procedure Laterality Date  . COLONOSCOPY     & endoscopy done at the same time as the colonoscopy  . SHUNT REVISION VENTRICULAR-PERITONEAL N/A 03/01/2014   Procedure: SHUNT REVISION VENTRICULAR-PERITONEAL;  Surgeon: Coletta MemosKyle Cabbell, MD;  Location: [facility];  Service: Neurosurgery;  Laterality: N/A;  Shunt  revision  . VENTRICULOPERITONEAL SHUNT Right 01/09/2014   Procedure: SHUNT INSERTION VENTRICULAR-PERITONEAL;  Surgeon: Coletta MemosKyle Cabbell, MD;  Location: [facility];  Service: Neurosurgery;  Laterality: Right;  right   . WISDOM TOOTH EXTRACTION  2015    Family History  Problem Relation Age of Onset  . Hypertension Mother   . Arthritis/Rheumatoid Mother   . Hypertension Father   . Migraines Father   . Stroke Paternal Grandfather   . Hypertension Paternal Grandfather   . Diabetes Paternal Grandmother   . Hypertension Paternal Grandmother   . Diabetes Maternal Grandfather   . Colon cancer Maternal Grandfather   . Hypertension Maternal Grandfather   . Breast cancer Maternal Grandmother [id number]    Social History   Socioeconomic History  . Marital status: Single    Spouse name: Not on file  . Number of children: 0  . Years of education: Some colle  . Highest education level: Not on file  Occupational History  . Occupation: full time student  Social Needs  . Financial resource strain: Not on file  . Food insecurity    Worry: Not on file    Inability: Not on file  . Transportation needs    Medical: Not on file    Non-medical: Not on file  Tobacco Use  . Smoking status: Never Smoker  . Smokeless tobacco: Never Used  Substance and Sexual Activity  . Alcohol  use: No  . Drug use: No  . Sexual activity: Yes    Birth control/protection: None  Lifestyle  . Physical activity    Days per week: Not on file    Minutes per session: Not on file  . Stress: Not on file  Relationships  . Social Herbalist on phone: Not on file    Gets together: Not on file    Attends religious service: Not on file    Active member of club or organization: Not on file    Attends meetings of clubs or organizations: Not on file    Relationship status: Not on file  . Intimate partner violence    Fear of current or ex partner: Not on file    Emotionally abused: Not on file    Physically abused:  Not on file    Forced sexual activity: Not on file  Other Topics Concern  . Not on file  Social History Narrative   Lives at home with parents and sister.    Caffeine use: Soda: 2 drinks per week     Current Outpatient Medications:  .  EPIDUO 0.1-2.5 % gel, Apply 1 application topically as needed., Disp: , Rfl: 0 .  Galcanezumab-gnlm (EMGALITY, 300 MG DOSE,) 100 MG/ML SOSY, Inject 300 mg into the skin every 30 (thirty) days., Disp: 3 mL, Rfl: 11 .  levocetirizine (XYZAL) 5 MG tablet, Take 5 mg by mouth every evening. , Disp: , Rfl:  .  Melatonin 10 MG TABS, Take 10 mg by mouth daily., Disp: , Rfl:  .  pantoprazole (PROTONIX) 40 MG tablet, Take 40 mg by mouth at bedtime. , Disp: , Rfl:  .  tiZANidine (ZANAFLEX) 4 MG tablet, TAKE 1 TABLET BY MOUTH EVERY 6 HOURS IF NEEDED FOR MUSCLE SPASM OR HEADACHE, Disp: 30 tablet, Rfl: 5     ROS:  Review of Systems  Constitutional: Negative for fatigue, fever and unexpected weight change.  Respiratory: Negative for cough, shortness of breath and wheezing.   Cardiovascular: Negative for chest pain, palpitations and leg swelling.  Gastrointestinal: Negative for blood in stool, constipation, diarrhea, nausea and vomiting.  Endocrine: Negative for cold intolerance, heat intolerance and polyuria.  Genitourinary: Negative for dyspareunia, dysuria, flank pain, frequency, genital sores, hematuria, menstrual problem, pelvic pain, urgency, vaginal bleeding, vaginal discharge and vaginal pain.  Musculoskeletal: Negative for back pain, joint swelling and myalgias.  Skin: Negative for rash.  Neurological: Positive for headaches. Negative for dizziness, syncope, light-headedness and numbness.  Hematological: Negative for adenopathy.  Psychiatric/Behavioral: Positive for agitation. Negative for confusion, sleep disturbance and suicidal ideas. The patient is not nervous/anxious.   BREAST: No symptoms   Objective: BP 130/90   Ht 5' 3.5" (1.613 m)   Wt 208 lb  (94.3 kg)   LMP 10/02/2018 (Exact Date)   BMI 36.27 kg/m    Physical Exam Constitutional:      Appearance: She is well-developed.  Genitourinary:     Vulva, vagina, cervix, uterus, right adnexa and left adnexa normal.     No vulval lesion or tenderness noted.     No vaginal discharge, erythema or tenderness.     No cervical polyp.     Uterus is not enlarged or tender.     No right or left adnexal mass present.     Right adnexa not tender.     Left adnexa not tender.  Neck:     Musculoskeletal: Normal range of motion.     Thyroid:  No thyromegaly.  Cardiovascular:     Rate and Rhythm: Normal rate and regular rhythm.     Heart sounds: Normal heart sounds. No murmur.  Pulmonary:     Effort: Pulmonary effort is normal.     Breath sounds: Normal breath sounds.  Chest:     Breasts:        Right: No mass, nipple discharge, skin change or tenderness.        Left: No mass, nipple discharge, skin change or tenderness.  Abdominal:     Palpations: Abdomen is soft.     Tenderness: There is no abdominal tenderness. There is no guarding.  Musculoskeletal: Normal range of motion.  Neurological:     General: No focal deficit present.     Mental Status: She is alert and oriented to person, place, and time.     Cranial Nerves: No cranial nerve deficit.  Skin:    General: Skin is warm and dry.  Psychiatric:        Mood and Affect: Mood normal.        Behavior: Behavior normal.        Thought Content: Thought content normal.        Judgment: Judgment normal.  Vitals signs reviewed.     Assessment/Plan: Encounter for annual routine gynecological examination  Cervical cancer screening - Plan: Cytology - PAP  Screening for STD (sexually transmitted disease) - Plan: Cytology - PAP  Family history of breast cancer--MyRisk testing discussed and handout given. Pt to f/u if desires testing.  Needs flu shot - Plan: Flu Vaccine QUAD 36+ mos IM (Fluarix, Quad PF)            GYN counsel  mammography screening, adequate intake of calcium and vitamin D, diet and exercise     F/U  Return in about 1 year (around 10/13/2019).  Alicia B. Copland, PA-C 10/13/2018 10:38 AM

## 2018-10-13 ENCOUNTER — Encounter: Payer: Self-pay | Admitting: Obstetrics and Gynecology

## 2018-10-13 ENCOUNTER — Other Ambulatory Visit (HOSPITAL_COMMUNITY)
Admission: RE | Admit: 2018-10-13 | Discharge: 2018-10-13 | Disposition: A | Discharge status: Home / Self Care | Payer: 59 | Source: Ambulatory Visit | Attending: Obstetrics and Gynecology | Admitting: Obstetrics and Gynecology

## 2018-10-13 ENCOUNTER — Ambulatory Visit (INDEPENDENT_AMBULATORY_CARE_PROVIDER_SITE_OTHER): Payer: 59 | Admitting: Obstetrics and Gynecology

## 2018-10-13 ENCOUNTER — Other Ambulatory Visit: Payer: Self-pay

## 2018-10-13 VITALS — BP 130/90 | Ht 63.5 in | Wt 208.0 lb

## 2018-10-13 DIAGNOSIS — Z124 Encounter for screening for malignant neoplasm of cervix: Secondary | ICD-10-CM | POA: Insufficient documentation

## 2018-10-13 DIAGNOSIS — Z113 Encounter for screening for infections with a predominantly sexual mode of transmission: Secondary | ICD-10-CM | POA: Diagnosis present

## 2018-10-13 DIAGNOSIS — Z01419 Encounter for gynecological examination (general) (routine) without abnormal findings: Secondary | ICD-10-CM | POA: Diagnosis not present

## 2018-10-13 DIAGNOSIS — Z23 Encounter for immunization: Secondary | ICD-10-CM

## 2018-10-13 DIAGNOSIS — Z803 Family history of malignant neoplasm of breast: Secondary | ICD-10-CM

## 2018-10-13 NOTE — Patient Instructions (Signed)
I value your feedback and entrusting us with your care. If you get a [facility] patient survey, I would appreciate you taking the time to let us know about your experience today. Thank you! 

## 2018-10-17 LAB — CYTOLOGY - PAP
Chlamydia: NEGATIVE
Diagnosis: NEGATIVE
Molecular Disclaimer: NEGATIVE
Neisseria Gonorrhea: NEGATIVE

## 2019-04-20 ENCOUNTER — Encounter: Payer: Self-pay | Admitting: Family Medicine

## 2019-05-24 ENCOUNTER — Telehealth: Payer: Self-pay | Admitting: *Deleted

## 2019-05-24 NOTE — Telephone Encounter (Signed)
Pt requested an appt for 5/3 or after. I called and offered openings on 5/3 and 5/4. Pt unable to come then at this time. She will call back next week to schedule.

## 2019-06-27 ENCOUNTER — Ambulatory Visit: Payer: 59 | Admitting: Gastroenterology

## 2019-06-27 ENCOUNTER — Other Ambulatory Visit: Payer: Self-pay

## 2019-06-27 VITALS — BP 126/85 | HR 82 | Temp 98.5°F | Ht 63.5 in | Wt 213.0 lb

## 2019-06-27 DIAGNOSIS — K219 Gastro-esophageal reflux disease without esophagitis: Secondary | ICD-10-CM | POA: Diagnosis not present

## 2019-06-27 NOTE — Patient Instructions (Signed)

## 2019-06-27 NOTE — Progress Notes (Signed)
Wyline Mood MD, MRCP(U.K) [address]  [address]  [place], [place] [address]  Main: [phone]  Fax: [phone]   Gastroenterology Consultation  Referring Provider:     Rolm Gala, MD Primary Care Physician:  Rolm Gala, MD Primary Gastroenterologist:  Dr. Wyline Mood  Reason for Consultation:    Second opinion for acid reflux        HPI:   Theresa Hicks is a 24 y.o. y/o female referred for consultation & management  by Dr. Rolm Gala, MD.    She is here today to see me for a second opinion on acid reflux.  She was seen by Dr. Norma Fredrickson on 04/11/2019 for the same reason.  History of cholecystectomy and LA grade a esophagitis in 2014 on endoscopy.  Has been on pantoprazole for some years.  She says that over the years she has gained weight and her acid reflux symptoms have got worse.  Symptoms usually occur in the middle of the night characterized by heartburn.  She takes her pantoprazole or omeprazole which she has been changed recently after her dinner.  No other complaints.  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    Vaccine for human papilloma virus (HPV) types 6, 11, 16, and 18 administered     Past Surgical History:  Procedure Laterality Date   COLONOSCOPY     & endoscopy done at the same time as the colonoscopy   SHUNT REVISION VENTRICULAR-PERITONEAL N/A 03/01/2014   Procedure: SHUNT REVISION VENTRICULAR-PERITONEAL;  Surgeon: Coletta Memos, MD;  Location: [facility];  Service: Neurosurgery;  Laterality:  N/A;  Shunt revision   VENTRICULOPERITONEAL SHUNT Right 01/09/2014   Procedure: SHUNT INSERTION VENTRICULAR-PERITONEAL;  Surgeon: Coletta Memos, MD;  Location: [facility];  Service: Neurosurgery;  Laterality: Right;  right    WISDOM TOOTH EXTRACTION  2015    Prior to Admission medications   Medication Sig Start Date End Date Taking? Authorizing Provider  citalopram (CELEXA) 20 MG tablet  06/15/19   [provider]  EPIDUO 0.1-2.5 % gel Apply 1 application topically as needed. 06/21/14   [provider]  Galcanezumab-gnlm (EMGALITY, 300 MG DOSE,) 100 MG/ML SOSY Inject 300 mg into the skin every 30 (thirty) days. 02/08/18   Anson Fret, MD  levocetirizine (XYZAL) 5 MG tablet Take 5 mg by mouth every evening.     [provider]  Melatonin 10 MG TABS Take 10 mg by mouth daily.    [provider]  omeprazole (PRILOSEC) 40 MG capsule Take 40 mg by mouth daily. 06/15/19   [provider]  pantoprazole (PROTONIX) 40 MG tablet Take 40 mg by mouth at bedtime.     [provider]  tiZANidine (ZANAFLEX) 4 MG tablet TAKE 1 TABLET BY MOUTH EVERY 6 HOURS IF NEEDED FOR MUSCLE SPASM OR HEADACHE 01/11/18   Anson Fret, MD    Family History  Problem Relation Age of Onset   Hypertension Mother    Arthritis/Rheumatoid Mother  Hypertension Father    Migraines Father    Stroke Paternal Grandfather    Hypertension Paternal Grandfather    Diabetes Paternal Grandmother    Hypertension Paternal Grandmother    Diabetes Maternal Grandfather    Colon cancer Maternal Grandfather    Hypertension Maternal Grandfather    Breast cancer Maternal Grandmother 40     Social History   Tobacco Use   Smoking status: Never Smoker   Smokeless tobacco: Never Used  Substance Use Topics   Alcohol use: No   Drug use: No    Allergies as of 06/27/2019 - Review Complete 06/27/2019  Allergen Reaction Noted   Imitrex [sumatriptan]  Anaphylaxis and Swelling 12/27/2013   Peanuts [peanut oil] Anaphylaxis 12/27/2013   Citrullus vulgaris Rash 04/16/2014   Lemon oil Swelling 04/16/2014   Other Swelling 09/16/2014   Strawberry extract Swelling 04/16/2014   Estrogens Other (See Comments) 09/16/2014    Review of Systems:    All systems reviewed and negative except where noted in HPI.   Physical Exam:  BP 126/85    Pulse 82    Temp 98.5 F (36.9 C)    Ht 5' 3.5" (1.613 m)    Wt 213 lb (96.6 kg)    BMI 37.14 kg/m  No LMP recorded. Psych:  Alert and cooperative. Normal mood and affect. General:   Alert,  Well-developed, well-nourished, pleasant and cooperative in NAD Head:  Normocephalic and atraumatic. Lungs:  Respirations even and unlabored.  Clear throughout to auscultation.   No wheezes, crackles, or rhonchi. No acute distress. Heart:  Regular rate and rhythm; no murmurs, clicks, rubs, or gallops. Abdomen:  Normal bowel sounds.  No bruits.  Soft, non-tender and non-distended without masses, hepatosplenomegaly or hernias noted.  No guarding or rebound tenderness.    Neurologic:  Alert and oriented x3;  grossly normal neurologically. Psych:  Alert and cooperative. Normal mood and affect.  Imaging Studies: No results found.  Assessment and Plan:   Theresa Hicks is a 24 y.o. y/o female has been referred for a second opinion regarding acid reflux.  She is very young being 25 years old and has had acid reflux for many years.  She has been on a PPI previously was better controlled and of late not well controlled.  Unfortunately she has been taking her PPI after her dinner which does not work as food in the stomach stimulates acid production and taking the PPI after eating would not do its  job.  In addition she has gained weight which makes it more likely for the acid reflux to get worse.  Plan 1.  Start taking omeprazole 40 mg once a day first thing in the morning on an empty stomach.  If it does not work can  increase it to 40 twice daily or change to [employer].  If there is a decent response and has residual symptoms can add Pepcid at night.  2.  Discussed in depth via lifestyle changes including using a wedge pillow avoiding meals for 2 hours before bedtime, losing weight and correct utilization of PPI.  3.  Since she is very young we need to have a long-term plan as it is unknown whether taking a PPI for 40 or 50 years would have any long-term side effects.  I did suggest at some point she needs to consider if she would be interested in more long-lasting solution such as antireflux surgery which would give her an option to go off the medications.  Follow up  in as needed  Dr Wyline Mood MD,MRCP(U.K)

## 2019-09-10 NOTE — Progress Notes (Signed)
[facility]    Provider:  Dr Lucia GaskinsAhern Referring Provider: Rolm GalaGrandis, [name], MD Primary Care Physician:  Rolm GalaGrandis, [name], MD  CC: IIH, cluster headache  Interval history September 10, 2019: Patient has not followed up with us for over a year, we saw her in the past for IIH and cluster headaches. IDIOPATHIC INTRACRANIAL HYPERTENSION improved, no further symptoms or vision changes, she has been following with ophthalmology and she saw them last July (recommended follow up with optho as well within a few weeks) she is s/p VP shunt and doing very well. But the clusters are back. Emgality helped in the past but the prescription expired. She is in a cluster right now, she is getting them every other night, they can last 30 minutes and be severe, she takes zanaflex, she is allergic to imitrex,   Patient complains of symptoms per HPI as well as the following symptoms:cluster headaches . Pertinent negatives and positives per HPI. All others negative   Interval history 02/08/2018: Here for follow up of IIH s/p shunt, migraines. Tried imitrex acutely and had side effects. Gave naratriptan as longer acting to see if they would help before bed for her recent cluster headaches. Also gave her a prednisone taper at the start of her cluster headaches.  In early December having a new headache that wakes her every night at the same time. It wakes her, it is excruciating, she is agitated and walking around, always on the right, she gets congested on the right. Lasts 30 minutes one night it lasted an hour. She had it once when napping during the day, always around the same time at night.  Interval history 08/18/2017:  Doing well, had shunt reset, no headaches, no vision changes, she has follow up in ophthalmology, she is feeling fine not symptomatic at all. She stopped the Topiramate, she had side effects.  Continue to follow with Dr. Franky Machoabbell.  IMPRESSION: Satisfactory post treatment appearance status  post RIGHT frontal ventriculoperitoneal shunt. Normal ventricular size. No overdrainage or extracerebral fluid complication.  Interval history 08/17/2016: Headaches are improved. The only thing that works at onset has been tizanidine. Only 2x a a month, sharp on the back of the right eye, she has light sensitivity, no nausea or vomiting. Continue Tizanidine. She istaking 200mg  of the Topiramate doing well. Vision is stable, still seeing Dr. Hazle Quantigby and sees him yearly. Discussed teratogenicity.   Interval history 07/15/2015:   On Topiramate. Tried Cambia and reglan for prn. On zanaflex. Has migraines since the age of 24 and also IIH s/p shunting. She is having 1-2 migraines a week. She has a little tingling. The headaches are around the right eyes, light sensitive, sound sensitive. They are lasting a day. Sleep helps. She is allergic to imitrex, throat swelled up. She gets some nausea with the headaches. Zanaflex helps with the migraines. For acute management with try fioricet and zofran. She has some tingling in the hands. No vision changes. She last saw Dr. Hazle Quantigby in January and has an upcoming appointment. No problems with vision. Headaches are once a week. The headaches start slowly and build up, the zanaflex helps, allergy to imitrex, fioricet did not work, Human resources officercambia did not work nor did Engineer, miningthe reglan. Zanaflex helps.   Interval history 04/14/2015: She has been having headaches. Maybe 1-2x a week. They can last a day. Started after christmas. Some times it is the whole head, pressure. Sometimes it is just behind the right eye. Cambia did not helps. No changes in vision.  She saw dr. Hazle Quant in January or February. Will request notes from Dr. Hazle Quant. She has light sensitivity. Nausea. Cambia didn't help. Allergy to imitrex. Dad with migraines. She takes a zanflex which helps her sleep. Discussed triggers. She is not getting enough sleep. Not morning headaches.   Interval history 09/16/2014: Since March she has  had only a few migraines. She never needed to take the Cambia. The headaches go away on their own. No daily headaches, no pressure headaches, no changes in vision. She had a migraine last night, light sensitivity, an aura. She still has the migraine today, we can try a shot of toradol in the clinic.Gave her a cambia sample and a carrying case with a water cup so she can carry it easier and take it at onset of headache.   Initial visit 04/15/2013: Theresa Hicks is a 24 y.o. female here as a referral from Dr. Gavin Potters for elevated intracranial hypertension s/p VPshunt. She was on diamox  tid and lasix. Had shunt revision in February and since then hasn't had a headache. Her vision is fine, she needs glasses. 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 Then junior year she started having changing symptoms, she had ringling in the ears, pressure on the right side. Freshman year in college she couldn't read, she was having daily headaches, for 4 years. She was having LPs at [facility] which made her feel better. She would have severe headaches and double vision. She was up to 24 opening pressure. She was on high doses or diamox, nothing else helped but the lumbar punctures. Now her headaches are improved and are occasional, usually only when a storm is coming. She sometimes gets headaches around a storm. She went through a lot with her headaches, botox, DHE, trigger injections, failed multiple medications and nothing would help until the shunt. 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 These headaches re more the whole head, no nausea, +light sensitivity. Can get to a 5/10 and last 4-5 hours. Allergies to imitrex.   Reviewed notes, labs and imaging from outside physicians, which showed:  Personally reviewed CT images of the head 2016, which showed right frontal VP shunt without ventriculomegaly  Review of  Systems: Patient complains of symptoms per HPI as well as the following symptoms: fatigue, light sensitivity, env allergies, food allergies, headache, frequent waking. Pertinent negatives per HPI. All others negative.  Social History   Socioeconomic History  . Marital status: Single    Spouse name: Not on file  . Number of children: 0  . Years of education: Some colle  . Highest education level: Not on file  Occupational History  . Occupation: nurse  Tobacco Use  . Smoking status: Never Smoker  . Smokeless tobacco: Never Used  Vaping Use  . Vaping Use: Never used  Substance and Sexual Activity  . Alcohol use: No  . Drug use: No  . Sexual activity: Yes    Birth control/protection: None  Other Topics Concern  . Not on file  Social History Narrative   Lives at home with parents and sister.    Caffeine use: Soda: 2 drinks per week   Social Determinants of Corporate investment banker Strain:   . Difficulty of Paying Living Expenses:   Food Insecurity:   . Worried About Programme researcher, broadcasting/film/video in the Last Year:   . [employer] of Food in the  Last Year:   Transportation Needs:   . Freight forwarder (Medical):   Marland Kitchen Lack of Transportation (Non-Medical):   Physical Activity:   . Days of Exercise per Week:   . Minutes of Exercise per Session:   Stress:   . Feeling of Stress :   Social Connections:   . Frequency of Communication with Friends and Family:   . Frequency of Social Gatherings with Friends and Family:   . Attends Religious Services:   . Active Member of Clubs or Organizations:   . Attends Banker Meetings:   Marland Kitchen Marital Status:   Intimate Partner Violence:   . Fear of Current or Ex-Partner:   . Emotionally Abused:   Marland Kitchen Physically Abused:   . Sexually Abused:     Family History  Problem Relation Age of Onset  . Hypertension Mother   . Arthritis/Rheumatoid Mother   . Hypertension Father   . Migraines Father   . Stroke Paternal Grandfather   .  Hypertension Paternal Grandfather   . Diabetes Paternal Grandmother   . Hypertension Paternal Grandmother   . Diabetes Maternal Grandfather   . Colon cancer Maternal Grandfather   . Hypertension Maternal Grandfather   . Breast cancer Maternal Grandmother 40    Past Medical History:  Diagnosis Date  . Anemia   . Diabetes mellitus without complication (HCC)    prediabetes, but takes Metformin for PCOS  . Environmental allergies   . GERD (gastroesophageal reflux disease)   . Headache    migraines & Intracranial hypertension   . Hypothyroid   . PCOS (polycystic ovarian syndrome)   . PONV (postoperative nausea and vomiting)    little nausea after shunt placement  . 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   . Vaccine for human papilloma virus (HPV) types 6, 11, 16, and 18 administered     Past Surgical History:  Procedure Laterality Date  . COLONOSCOPY     & endoscopy done at the same time as the colonoscopy  . SHUNT REVISION VENTRICULAR-PERITONEAL N/A 03/01/2014   Procedure: SHUNT REVISION VENTRICULAR-PERITONEAL;  Surgeon: Coletta Memos, MD;  Location: [facility];  Service: Neurosurgery;  Laterality: N/A;  Shunt revision  . VENTRICULOPERITONEAL SHUNT Right 01/09/2014   Procedure: SHUNT INSERTION VENTRICULAR-PERITONEAL;  Surgeon: Coletta Memos, MD;  Location: [facility];  Service: Neurosurgery;  Laterality: Right;  right   . WISDOM TOOTH EXTRACTION  2015    Current Outpatient Medications  Medication Sig Dispense Refill  . citalopram (CELEXA) 20 MG tablet Take 10 mg by mouth daily.     . Galcanezumab-gnlm (EMGALITY, 300 MG DOSE,) 100 MG/ML SOSY Inject 300 mg into the skin every 30 (thirty) days. During cluster cycle. 3 mL 11  . levocetirizine (XYZAL) 5 MG tablet Take 5 mg by mouth every evening.     . Melatonin 10 MG TABS Take 10 mg by mouth daily.    Marland Kitchen omeprazole (PRILOSEC) 40 MG capsule Take 40 mg by mouth daily.    Marland Kitchen tiZANidine (ZANAFLEX)  4 MG tablet TAKE 1 TABLET BY MOUTH EVERY 6 HOURS IF NEEDED FOR MUSCLE SPASM OR HEADACHE 30 tablet 5  . tiZANidine (ZANAFLEX) 4 MG tablet Take 12 tablets (48 mg total) by mouth every 6 (six) hours as needed for muscle spasms. 30 tablet 11   No current facility-administered medications for this visit.    Allergies as of 09/11/2019 - Review Complete 09/11/2019  Allergen Reaction Noted  .  Imitrex [sumatriptan] Anaphylaxis and Swelling 12/27/2013  . Peanuts [peanut oil] Anaphylaxis 12/27/2013  . 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 Lemon oil Swelling 04/16/2014  . Other Swelling 09/16/2014  . Strawberry extract Swelling 04/16/2014  . Estrogens Other (See Comments) 09/16/2014    Vitals: BP 119/72 (BP Location: Right Arm, Patient Position: Sitting, Cuff Size: Large)   Pulse 67   Ht 5\' 3"  (1.6 m)   Wt 207 lb (93.9 kg)   BMI 36.67 kg/m  Last Weight:  Wt Readings from Last 1 Encounters:  09/11/19 207 lb (93.9 kg)   Last Height:   Ht Readings from Last 1 Encounters:  09/11/19 5\' 3"  (1.6 m)   Physical exam: Exam: Gen: NAD, conversant, well nourised, obese, well groomed                     CV: RRR, no MRG. No Carotid Bruits. No peripheral edema, warm, nontender Eyes: Conjunctivae clear without exudates or hemorrhage  Neuro: Detailed Neurologic Exam  Speech:    Speech is normal; fluent and spontaneous with normal comprehension.  Cognition:    The patient is oriented to person, place, and time;     recent and remote memory intact;     language fluent;     normal attention, concentration,     fund of knowledge Cranial Nerves:    The pupils are equal, round, and reactive to light. The fundi are normal and spontaneous venous pulsations are present. Visual fields are full to finger confrontation. Extraocular movements are intact. Trigeminal sensation is intact and the muscles of mastication are normal. The face is symmetric. The palate elevates in the midline. Hearing intact. Voice is  normal. Shoulder shrug is normal. The tongue has normal motion without fasciculations.   Coordination:    Normal finger to nose and heel to shin. Normal rapid alternating movements.   Gait:    Heel-toe and tandem gait are normal.   Motor Observation:    No asymmetry, no atrophy, and no involuntary movements noted. Tone:    Normal muscle tone.    Posture:    Posture is normal. normal erect    Strength:    Strength is V/V in the upper and lower limbs.      Sensation: intact to LT     Reflex Exam:  DTR's:    Deep tendon reflexes in the upper and lower extremities are normal bilaterally.   Toes:    The toes are downgoing bilaterally.   Clonus:    Clonus is absent.    Assessment/Plan: AMBRIA MAYFIELD is a 24 y.o. female here as a referral from Dr. Pamelia Hoit for elevated intracranial hypertension s/p VPshunt. Asymptomatic doing well.  - IIH: stable. No changes. - ophtho 1x yearly - neurology 1x yearly - cluster headache syndrome will restart emgality; discussed teratogenicity do not get pregnant on this medication or within 5 months of taking it - izanidine acutely - Obesity: Stable, needs weight loss. Discussed weight loss, recommended healthy weight and wellness center,  - Follow with Dr. 25 for vp shunt - fundoscopic exam normal today - images of last CT head with patient, normal vent size  Prior acute management - Reglan prn. Did not work. Cambia did not work prn. Had breathing and throat swelling with imitex (??) and never tried it again. Can try fioricet (very limited) and zofran at onset did not help. Tizanidine helps.  - recommend MRI brain with thin slices through pituitary and cavernouns sinus, she  declines at this time and will consider if no improvement  A total of 25 minutes was spent face-to-face with this patient. Over half this time was spent on counseling patient on the  1. IIH (idiopathic intracranial hypertension)   2. Episodic cluster headache,  not intractable     diagnosis and different diagnostic and therapeutic options, counseling and coordination of care, risks ans benefits of management, compliance, or risk factor reduction and education.      Naomie Dean, MD  [facility] [address] [place], [place] [address]  Phone [phone] Fax [phone]  I spent more than 25 minutes of face-to-face and non-face-to-face time with patient on the  1. IIH (idiopathic intracranial hypertension)   2. Episodic cluster headache, not intractable    diagnosis.  This included previsit chart review, lab review, study review, order entry, electronic health record documentation, patient education on the different diagnostic and therapeutic options, counseling and coordination of care, risks and benefits of management, compliance, or risk factor reduction

## 2019-09-11 ENCOUNTER — Encounter: Payer: Self-pay | Admitting: Neurology

## 2019-09-11 ENCOUNTER — Other Ambulatory Visit: Payer: Self-pay

## 2019-09-11 ENCOUNTER — Ambulatory Visit: Payer: 59 | Admitting: Neurology

## 2019-09-11 ENCOUNTER — Telehealth: Payer: Self-pay | Admitting: Neurology

## 2019-09-11 VITALS — BP 119/72 | HR 67 | Ht 63.0 in | Wt 207.0 lb

## 2019-09-11 DIAGNOSIS — G44019 Episodic cluster headache, not intractable: Secondary | ICD-10-CM | POA: Diagnosis not present

## 2019-09-11 DIAGNOSIS — G932 Benign intracranial hypertension: Secondary | ICD-10-CM | POA: Diagnosis not present

## 2019-09-11 MED ORDER — TIZANIDINE HCL 4 MG PO TABS: 4.0000 mg | ORAL_TABLET | Freq: Four times a day (QID) | ORAL | 11 refills | Status: AC | PRN

## 2019-09-11 MED ORDER — EMGALITY (300 MG DOSE) 100 MG/ML ~~LOC~~ SOSY: 300.0000 mg | PREFILLED_SYRINGE | SUBCUTANEOUS | 11 refills | Status: AC

## 2019-09-11 MED ORDER — TIZANIDINE HCL 4 MG PO TABS
48.0000 mg | ORAL_TABLET | Freq: Four times a day (QID) | ORAL | 11 refills | Status: DC | PRN
Start: 2019-09-11 — End: 2019-09-11

## 2019-09-11 NOTE — Telephone Encounter (Signed)
Rx has been sent again electronically with clarification per Dr Lucia Gaskins. Take 1-2 tablets (4-8 mg) by mouth every 6 hours as needed for muscle spasms. Also included note to cancel previous Tizanidine prescriptions.

## 2019-09-11 NOTE — Telephone Encounter (Signed)
[facility] [phone] - called questioning the dosage of pt's tiZANidine (ZANAFLEX) 4 MG tablet Pharmacist Vincenza Hews is asking for a call with clarity.  He can be reached at [phone] He left voicemail at 9:07a.m.

## 2019-09-11 NOTE — Addendum Note (Signed)
Addended by: Bertram Savin on: 09/11/2019 02:07 PM   Modules accepted: Orders

## 2019-09-11 NOTE — Telephone Encounter (Signed)
I spoke with Theresa Hicks @ [facility] and advised him of the instructions clarification for Tizanidine and that it had already been e-scribed. He verbalized appreciation.

## 2019-09-13 ENCOUNTER — Telehealth: Payer: Self-pay | Admitting: *Deleted

## 2019-09-13 NOTE — Telephone Encounter (Signed)
Submitted PA emgality 100mg /ml on CMM. Key: . Waiting on determination from [employer]. Pt ID: [id number]. Dx: cluster headaches

## 2019-09-13 NOTE — Telephone Encounter (Signed)
Received fax from [employer] that PA approved 09/13/19-10/14/19. PA# [employer] [id number]. Faxed notice of approval to [facility] at [phone]. Received fax confirmation.

## 2019-10-29 NOTE — Telephone Encounter (Signed)
Emgality PA request received from [employer]. I started PA on Cover My Meds. KEY: [id number]. I called the pt and LVM (ok per DPR) asking how she is doing on the Emgality. Left office number for call back.

## 2019-10-29 NOTE — Telephone Encounter (Signed)
Completed Emgality PA. Key: [id number]. Awaiting determination from [facility] [employer].

## 2019-10-31 NOTE — Telephone Encounter (Signed)
Per Cover My Meds, Emgality approved.   10/31/2019 - 10/30/2020  PA# [employer] [id number]

## 2019-10-31 NOTE — Telephone Encounter (Signed)
Faxed approval letter to [facility]. Received a receipt of confirmation.  

## 2019-11-07 ENCOUNTER — Other Ambulatory Visit: Payer: Self-pay

## 2019-11-07 MED ORDER — OMEPRAZOLE 40 MG PO CPDR: 40.0000 mg | DELAYED_RELEASE_CAPSULE | Freq: Two times a day (BID) | ORAL | 0 refills | Status: DC

## 2019-11-07 NOTE — Telephone Encounter (Signed)
Pt called to request a refill on her omeprazole 40 mg bid. However, pt states the medication as not been working very well. I reminded pt of Dr. Johnney Killian plan from pt's last visit, per Dr. Tobi Bastos, pt may try Dexilant as an alternative. I explained to pt that we can provide her with samples of the Dexilant here at our office. Pt agrees to try the Dexilant but still requests a refill on the omeprazole as she is not able to pick up the samples this week.

## 2019-11-16 IMAGING — CT CT HEAD W/O CM
3 series · 15 of 46 positions shown, 18 images · non-contrast
Comparison: 03/01/2014.

CLINICAL DATA: Shunt evaluation.  Headaches.

EXAM:
CT HEAD WITHOUT CONTRAST
TECHNIQUE: Contiguous axial images were obtained from the base of the skull
through the vertex without intravenous contrast.

[Series 2: head w/(date) · axial · 0.41mm/px · z∈[-145,-25]mm · 9 of 29 slices shown, 12 images]
[im 3/29  brain]
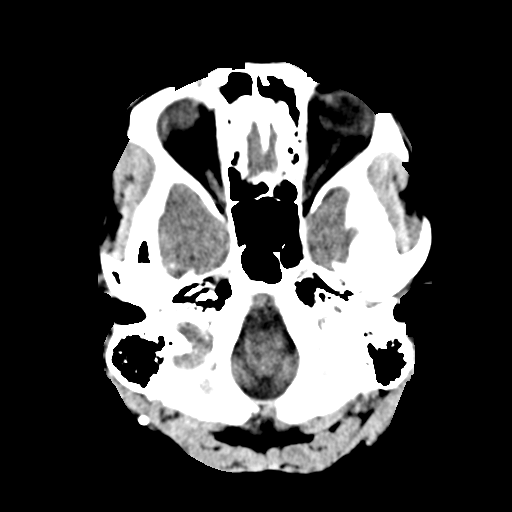
[im 3/29  bone]
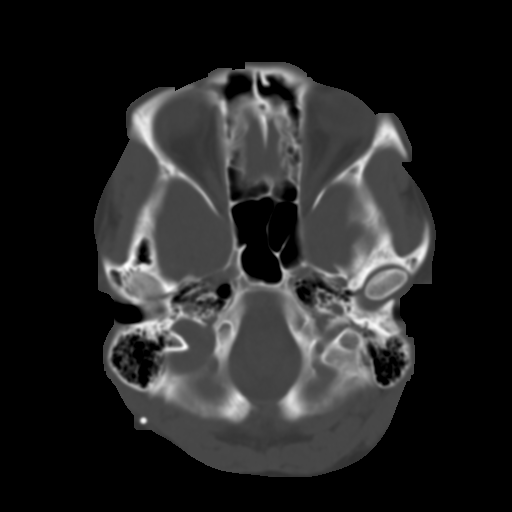
[im 6/29  brain]
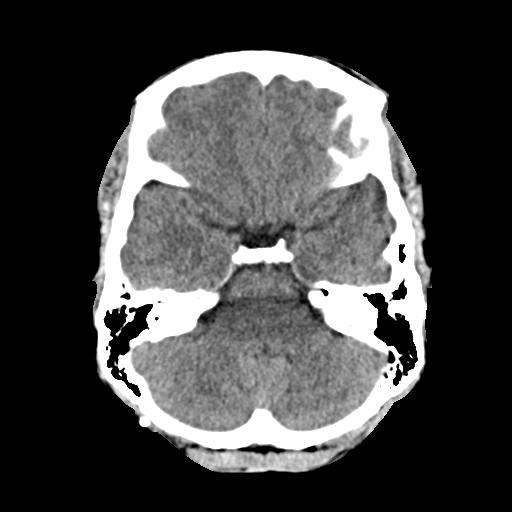
[im 9/29  brain]
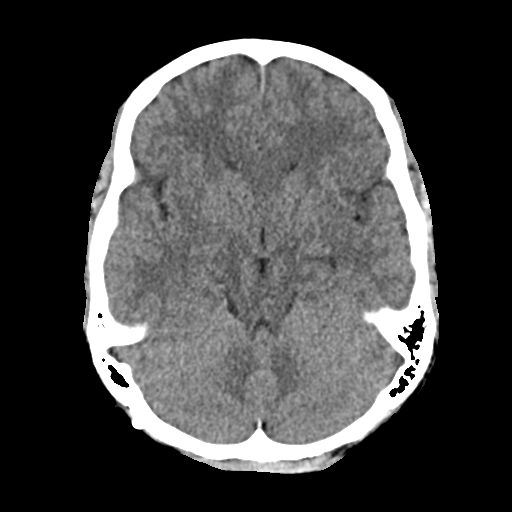
[im 12/29  brain]
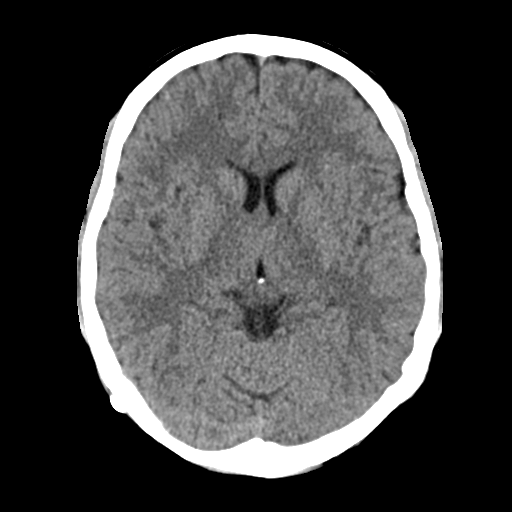
[im 15/29  brain]
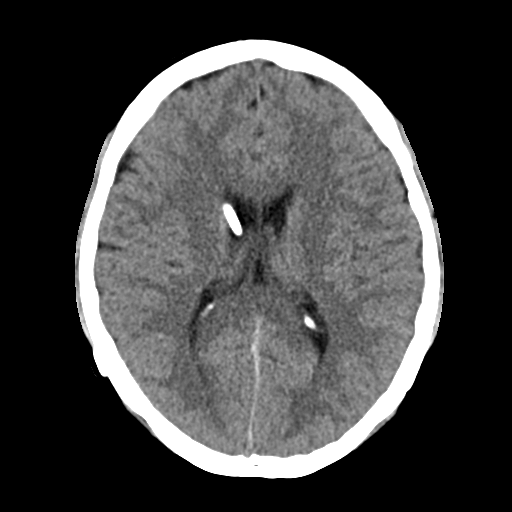
[im 15/29  bone]
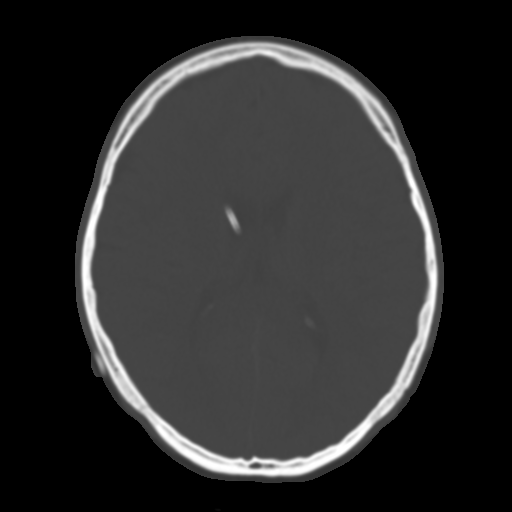
[im 18/29  brain]
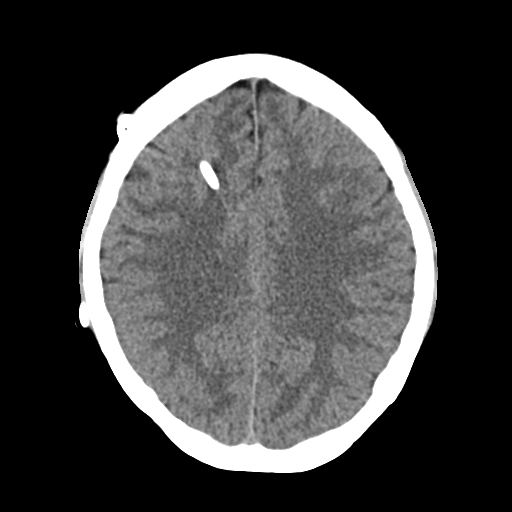
[im 21/29  brain]
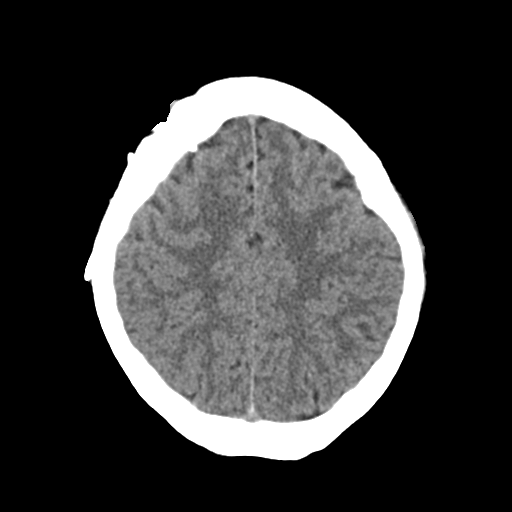
[im 24/29  brain]
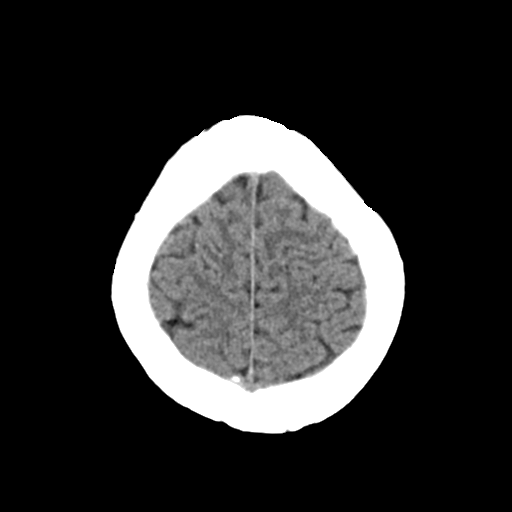
[im 27/29  brain]
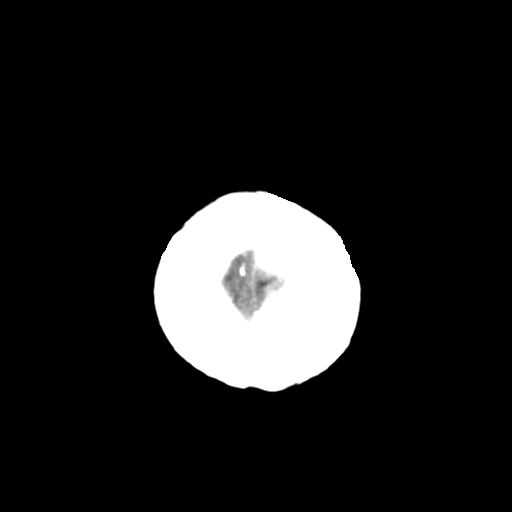
[im 27/29  bone]
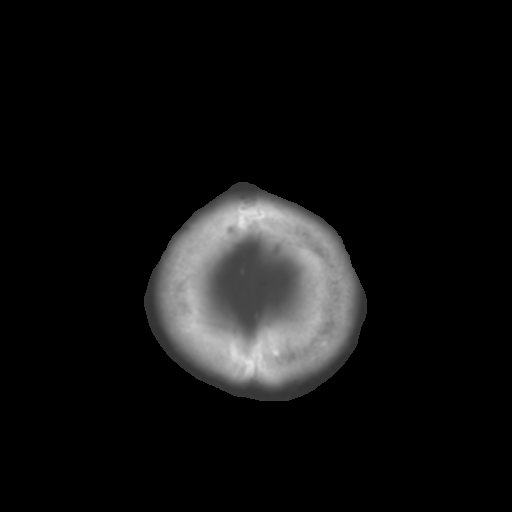

[Series 5: sag · sagittal · 0.32mm/px · 3 of 54 slices shown]
[im 18/54  brain]
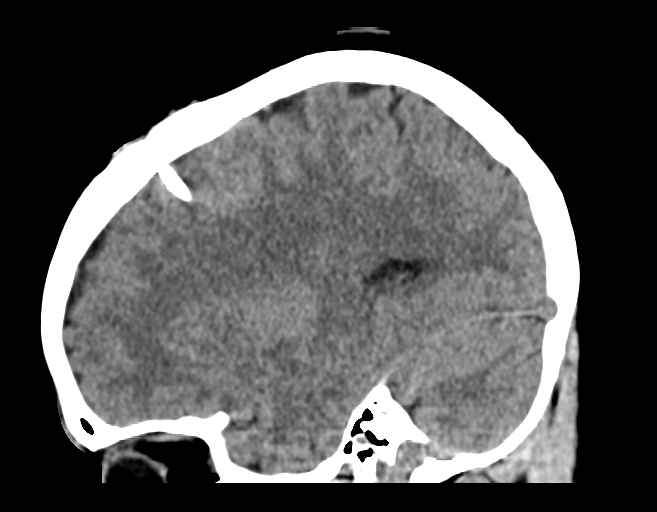
[im 27/54  brain]
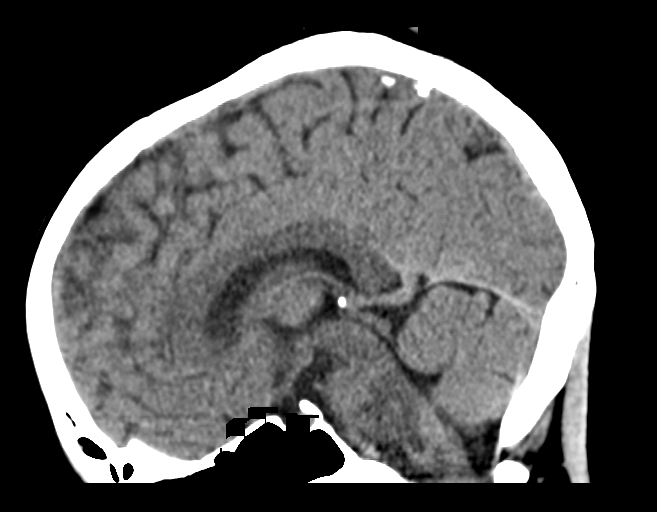
[im 36/54  brain]
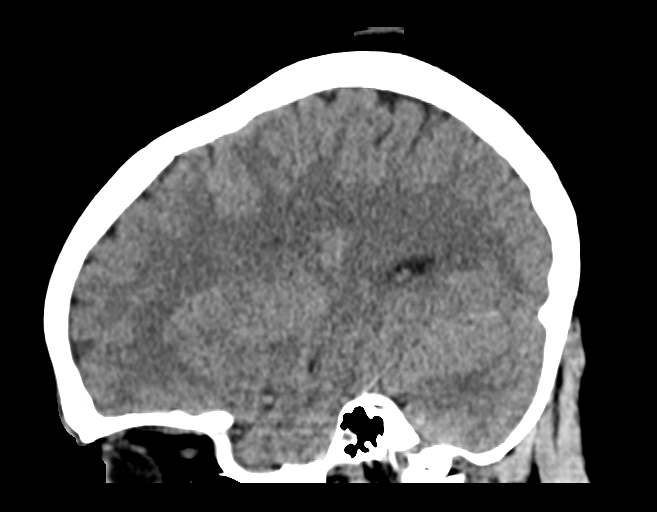

[Series 6: cor · coronal · 0.30mm/px · 3 of 67 slices shown]
[im 23/67  brain]
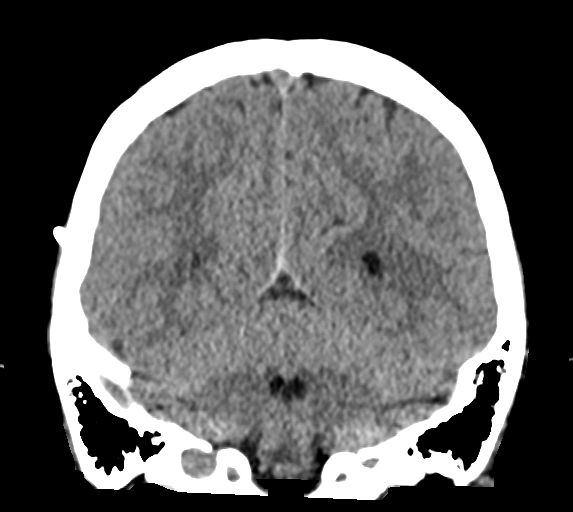
[im 30/67  brain]
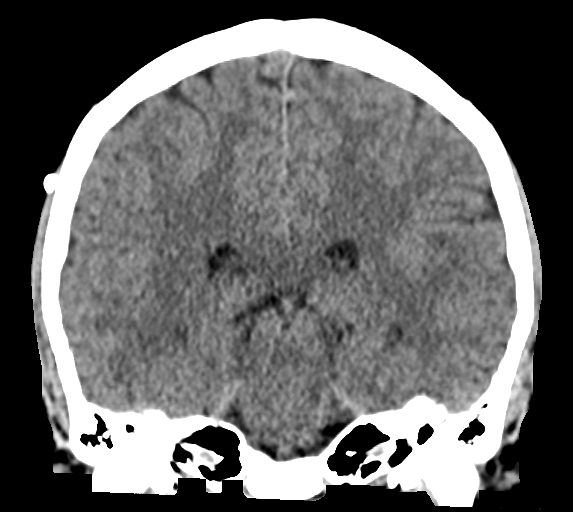
[im 37/67  brain]
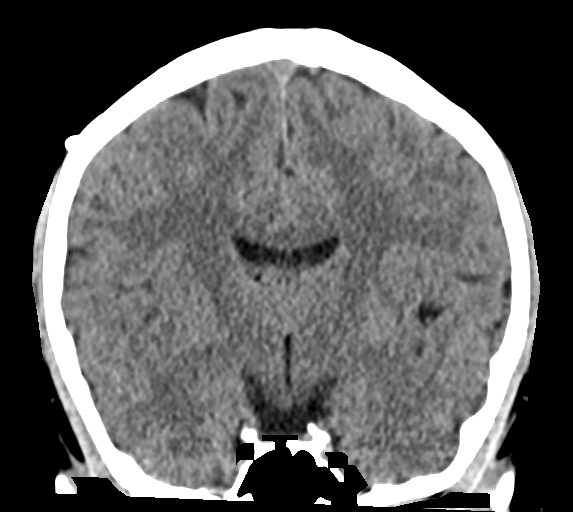

[15 of 46 positions shown; findings below may reference images not displayed]

FINDINGS: Brain: No evidence of acute stroke, hemorrhage, mass lesion,
hydrocephalus, or extra-axial fluid. Normal ventricular size. RIGHT
frontal shunt catheter appropriately positioned. Since the prior CT,
the shunt catheter has been redirected to lie entirely on the RIGHT
side.

Vascular: No hyperdense vessel or unexpected calcification.

Skull: Postsurgical change RIGHT frontal burr hole, otherwise
unremarkable.

Sinuses/Orbits: Negative.

Other: None.
IMPRESSION: Satisfactory post treatment appearance status post RIGHT frontal
ventriculoperitoneal shunt. Normal ventricular size. No overdrainage
or extracerebral fluid complication.

## 2019-12-06 ENCOUNTER — Other Ambulatory Visit: Payer: Self-pay | Admitting: Gastroenterology

## 2020-02-25 ENCOUNTER — Ambulatory Visit: Payer: 59 | Admitting: Dermatology

## 2020-02-25 ENCOUNTER — Other Ambulatory Visit: Payer: Self-pay

## 2020-02-25 DIAGNOSIS — L219 Seborrheic dermatitis, unspecified: Secondary | ICD-10-CM

## 2020-02-25 DIAGNOSIS — L700 Acne vulgaris: Secondary | ICD-10-CM | POA: Diagnosis not present

## 2020-02-25 MED ORDER — SPIRONOLACTONE 100 MG PO TABS: 100.0000 mg | ORAL_TABLET | Freq: Every day | ORAL | 3 refills | Status: AC

## 2020-02-25 MED ORDER — ADAPALENE-BENZOYL PEROXIDE 0.3-2.5 % EX GEL: 1.0000 "application " | Freq: Every day | CUTANEOUS | 3 refills | Status: AC

## 2020-02-25 MED ORDER — KETOCONAZOLE 2 % EX SHAM: 1.0000 "application " | MEDICATED_SHAMPOO | CUTANEOUS | 3 refills | Status: AC

## 2020-02-25 MED ORDER — FLUOCINOLONE ACETONIDE BODY 0.01 % EX OIL: 1.0000 "application " | TOPICAL_OIL | Freq: Two times a day (BID) | CUTANEOUS | 1 refills | Status: AC

## 2020-02-25 NOTE — Progress Notes (Deleted)
   Follow-Up Visit   Subjective  Theresa Hicks is a 25 y.o. female who presents for the following: No chief complaint on file..  ***  The following portions of the chart were reviewed this encounter and updated as appropriate:      {Review of Systems:34166::"No other skin or systemic complaints."}  Objective  Well appearing patient in no apparent distress; mood and affect are within normal limits.  {Exam:34163::"A full examination was performed including scalp, head, eyes, ears, nose, lips, neck, chest, axillae, abdomen, back, buttocks, bilateral upper extremities, bilateral lower extremities, hands, feet, fingers, toes, fingernails, and toenails. All findings within normal limits unless otherwise noted below."}   Assessment & Plan   No follow-ups on file.

## 2020-02-25 NOTE — Progress Notes (Signed)
   New Patient Visit  Subjective  Theresa Hicks is a 25 y.o. female who presents for the following: dandruff/scaly scalp (Scalp, ~22yr, otc shampoo) and Acne (Face, 24yrs, Epiduo in past). Gets red deep bumps 1-2x/month.  May be worse with her period.   The following portions of the chart were reviewed this encounter and updated as appropriate:       Review of Systems:  No other skin or systemic complaints except as noted in HPI or Assessment and Plan.  Objective  Well appearing patient in no apparent distress; mood and affect are within normal limits.  A focused examination was performed including scalp, face. Relevant physical exam findings are noted in the Assessment and Plan.  Objective  Scalp: Diffuse greasy scale throughout scalp.   Objective  face: Cystic pap cheek  BP today 125/86   Assessment & Plan  Seborrheic dermatitis Scalp  Extensive involvement Start Ketoconazole 2% shampoo q wk, let sit on scalp for 10 minutes and rinse out Start Derma-Smooth FS oil bid, cover with shower cap night before washing hair  Seborrheic Dermatitis  -  is a chronic persistent rash characterized by pinkness and scaling most commonly of the mid face but also can occur on the scalp (dandruff), ears; mid chest and mid back. It tends to be exacerbated by stress and cooler weather.  People who have neurologic disease may experience new onset or exacerbation of existing seborrheic dermatitis.  The condition is not curable but treatable and can be controlled.   ketoconazole (NIZORAL) 2 % shampoo - Scalp  Fluocinolone Acetonide Body 0.01 % OIL - Scalp  Acne vulgaris face  Start Spironolactone 100mg  1 po qd, may start out with 1/2 po qd for 1 week Start Epiduo forte qhs to face as tolerated   Spironolactone can cause increased urination and cause blood pressure to decrease. Please watch for signs of lightheadedness and be cautious when changing position. It can sometimes cause  breast tenderness or an irregular period in premenopausal women. It can also increase potassium. The increase in potassium usually is not a concern unless you are taking other medicines that also increase potassium, so please be sure your doctor knows all of the other medications you are taking. This medication should not be taken by pregnant women.  This medicine should also not be taken together with sulfa drugs like Bactrim (trimethoprim/sulfamethexazole).   Topical retinoid medications like tretinoin/Retin-A, adapalene/Differin, tazarotene/Fabior, and Epiduo/Epiduo Forte can cause dryness and irritation when first started. Only apply a pea-sized amount to the entire affected area. Avoid applying it around the eyes, edges of mouth and creases at the nose. If you experience irritation, use a good moisturizer first and/or apply the medicine less often. If you are doing well with the medicine, you can increase how often you use it until you are applying every night. Be careful with sun protection while using this medication as it can make you sensitive to the sun. This medicine should not be used by pregnant women.     spironolactone (ALDACTONE) 100 MG tablet - face  Adapalene-Benzoyl Peroxide (EPIDUO FORTE) 0.3-2.5 % GEL - face  Return in about 2 months (around 04/24/2020) for seb derm, acne.  Documentation: I have reviewed the above documentation for accuracy and completeness, and I agree with the above.  04/26/2020 MD

## 2020-02-25 NOTE — Patient Instructions (Addendum)
For the Derma-Smooth FS oil, the day before you wash hair apply oil to scalp and cover with shower cap.  The next day you can wash out.

## 2020-04-21 ENCOUNTER — Ambulatory Visit: Payer: 59 | Admitting: Dermatology

## 2020-09-11 ENCOUNTER — Ambulatory Visit: Payer: 59 | Admitting: Neurology

## 2021-07-09 ENCOUNTER — Other Ambulatory Visit: Payer: Self-pay | Admitting: Dermatology

## 2021-07-09 DIAGNOSIS — L219 Seborrheic dermatitis, unspecified: Secondary | ICD-10-CM

## 2022-01-25 DIAGNOSIS — Z419 Encounter for procedure for purposes other than remedying health state, unspecified: Secondary | ICD-10-CM | POA: Diagnosis not present

## 2022-02-16 DIAGNOSIS — M25531 Pain in right wrist: Secondary | ICD-10-CM | POA: Diagnosis not present

## 2022-02-16 DIAGNOSIS — M25431 Effusion, right wrist: Secondary | ICD-10-CM | POA: Diagnosis not present

## 2022-02-25 DIAGNOSIS — Z419 Encounter for procedure for purposes other than remedying health state, unspecified: Secondary | ICD-10-CM | POA: Diagnosis not present

## 2022-03-26 DIAGNOSIS — Z419 Encounter for procedure for purposes other than remedying health state, unspecified: Secondary | ICD-10-CM | POA: Diagnosis not present

## 2022-04-26 DIAGNOSIS — Z419 Encounter for procedure for purposes other than remedying health state, unspecified: Secondary | ICD-10-CM | POA: Diagnosis not present

## 2022-05-26 DIAGNOSIS — Z419 Encounter for procedure for purposes other than remedying health state, unspecified: Secondary | ICD-10-CM | POA: Diagnosis not present

## 2022-06-26 DIAGNOSIS — Z419 Encounter for procedure for purposes other than remedying health state, unspecified: Secondary | ICD-10-CM | POA: Diagnosis not present

## 2022-07-26 DIAGNOSIS — Z419 Encounter for procedure for purposes other than remedying health state, unspecified: Secondary | ICD-10-CM | POA: Diagnosis not present

## 2022-08-26 DIAGNOSIS — Z419 Encounter for procedure for purposes other than remedying health state, unspecified: Secondary | ICD-10-CM | POA: Diagnosis not present

## 2022-09-26 DIAGNOSIS — Z419 Encounter for procedure for purposes other than remedying health state, unspecified: Secondary | ICD-10-CM | POA: Diagnosis not present

## 2022-10-26 DIAGNOSIS — Z419 Encounter for procedure for purposes other than remedying health state, unspecified: Secondary | ICD-10-CM | POA: Diagnosis not present

## 2022-11-26 DIAGNOSIS — Z419 Encounter for procedure for purposes other than remedying health state, unspecified: Secondary | ICD-10-CM | POA: Diagnosis not present

## 2022-12-26 DIAGNOSIS — Z419 Encounter for procedure for purposes other than remedying health state, unspecified: Secondary | ICD-10-CM | POA: Diagnosis not present

## 2023-01-26 DIAGNOSIS — Z419 Encounter for procedure for purposes other than remedying health state, unspecified: Secondary | ICD-10-CM | POA: Diagnosis not present

## 2023-02-26 DIAGNOSIS — Z419 Encounter for procedure for purposes other than remedying health state, unspecified: Secondary | ICD-10-CM | POA: Diagnosis not present

## 2023-03-26 DIAGNOSIS — Z419 Encounter for procedure for purposes other than remedying health state, unspecified: Secondary | ICD-10-CM | POA: Diagnosis not present

## 2023-05-07 DIAGNOSIS — Z419 Encounter for procedure for purposes other than remedying health state, unspecified: Secondary | ICD-10-CM | POA: Diagnosis not present

## 2023-06-06 DIAGNOSIS — Z419 Encounter for procedure for purposes other than remedying health state, unspecified: Secondary | ICD-10-CM | POA: Diagnosis not present

## 2023-07-07 DIAGNOSIS — Z419 Encounter for procedure for purposes other than remedying health state, unspecified: Secondary | ICD-10-CM | POA: Diagnosis not present

## 2023-08-06 DIAGNOSIS — Z419 Encounter for procedure for purposes other than remedying health state, unspecified: Secondary | ICD-10-CM | POA: Diagnosis not present

## 2023-09-06 DIAGNOSIS — Z419 Encounter for procedure for purposes other than remedying health state, unspecified: Secondary | ICD-10-CM | POA: Diagnosis not present

## 2023-10-07 DIAGNOSIS — Z419 Encounter for procedure for purposes other than remedying health state, unspecified: Secondary | ICD-10-CM | POA: Diagnosis not present
